# Patient Record
Sex: Female | Born: 1980 | Race: White | Hispanic: Yes | Marital: Married | Smoking: Never smoker
Health system: Southern US, Community
[De-identification: ages and names within clinical notes are randomized; demographics above are authoritative.]

## PROBLEM LIST (undated history)

## (undated) DIAGNOSIS — M3500 Sicca syndrome, unspecified: Secondary | ICD-10-CM

## (undated) DIAGNOSIS — H469 Unspecified optic neuritis: Secondary | ICD-10-CM

## (undated) DIAGNOSIS — G378 Other specified demyelinating diseases of central nervous system: Secondary | ICD-10-CM

## (undated) DIAGNOSIS — G3781 Myelin oligodendrocyte glycoprotein antibody disease: Secondary | ICD-10-CM

## (undated) HISTORY — DX: Myelin oligodendrocyte glycoprotein antibody disease: G37.81

## (undated) HISTORY — DX: Other specified demyelinating diseases of central nervous system: G37.8

## (undated) HISTORY — DX: Unspecified optic neuritis: H46.9

## (undated) HISTORY — DX: Sjogren syndrome, unspecified: M35.00

---

## 2009-02-05 HISTORY — PX: DILATION AND CURETTAGE OF UTERUS: SHX78

## 2011-07-26 ENCOUNTER — Other Ambulatory Visit: Payer: Self-pay | Admitting: Family Medicine

## 2011-07-26 ENCOUNTER — Ambulatory Visit
Admission: RE | Admit: 2011-07-26 | Discharge: 2011-07-26 | Disposition: A | Discharge status: Home / Self Care | Payer: No Typology Code available for payment source | Source: Ambulatory Visit | Attending: Family Medicine | Admitting: Family Medicine

## 2011-07-26 DIAGNOSIS — Z0000 Encounter for general adult medical examination without abnormal findings: Secondary | ICD-10-CM

## 2016-07-06 LAB — HEPATIC FUNCTION PANEL
ALT: 14 (ref 7–35)
AST: 17 (ref 13–35)

## 2016-07-06 LAB — LIPID PANEL
HDL: 63 (ref 35–70)
LDL Cholesterol: 81
Triglycerides: 53 (ref 40–160)

## 2016-07-06 LAB — COMPREHENSIVE METABOLIC PANEL
Albumin: 4.4 (ref 3.5–5.0)
Calcium: 9.3 (ref 8.7–10.7)

## 2016-07-06 LAB — CBC AND DIFFERENTIAL
Hemoglobin: 12.1 (ref 12.0–16.0)
WBC: 5.7

## 2016-07-06 LAB — HEMOGLOBIN A1C: Hemoglobin A1C: 4.9

## 2016-07-06 LAB — BASIC METABOLIC PANEL
Creatinine: 0.1 — AB (ref 0.5–1.1)
Glucose: 83

## 2016-07-06 LAB — TSH: TSH: 1.57 (ref 0.41–5.90)

## 2017-06-14 ENCOUNTER — Encounter: Payer: Self-pay | Admitting: Family Medicine

## 2017-06-14 ENCOUNTER — Ambulatory Visit: Payer: Self-pay | Admitting: Pharmacist

## 2017-06-14 ENCOUNTER — Ambulatory Visit: Payer: Self-pay | Admitting: Family Medicine

## 2017-06-14 ENCOUNTER — Other Ambulatory Visit: Payer: Self-pay

## 2017-06-14 VITALS — BP 98/64 | HR 97 | Temp 98.3°F | Ht 66.5 in | Wt 160.4 lb

## 2017-06-14 DIAGNOSIS — Y9315 Activity, underwater diving and snorkeling: Secondary | ICD-10-CM

## 2017-06-14 DIAGNOSIS — Z0000 Encounter for general adult medical examination without abnormal findings: Secondary | ICD-10-CM

## 2017-06-14 LAB — GLUCOSE, POCT (MANUAL RESULT ENTRY): POC Glucose: 105 mg/dl — AB (ref 70–99)

## 2017-06-14 LAB — POCT HEMOGLOBIN: HEMOGLOBIN: 12.9 g/dL (ref 12.2–16.2)

## 2017-06-14 LAB — POCT UA - GLUCOSE/PROTEIN
GLUCOSE UA: NEGATIVE
Protein, UA: NEGATIVE

## 2017-06-14 NOTE — Assessment & Plan Note (Signed)
Vision (distance, near, color), hearing, UA, CBG, Hgb, and Spirometry all within normal limits. Normal CXR within past 5 years. EKG:  N/a due to age Coronary assessment:  N/a due to age -- low risk Approval for SCUBA diving, I find no medical conditions considered incompatible with diving.

## 2017-06-14 NOTE — Progress Notes (Signed)
Subjective:    Joyce [name] is a 37 y.o. female who presents to [facility] today for scuba diving physical for the [facility]:  1.  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  Has progressed through ranks to Personnel officer.  Previously employed by [employer] in 2013, left for [facility], now back for family and occupational reasons.  Had multiple fitness to dive physicals there.     Denies any complications or injuries while diving.  Has never failed a fitness to dive physical.  Currently well, without complaints.  The following portions of the patient's history were reviewed and updated as appropriate: allergies, current medications, past medical history, family and social history, and problem list.  PMH reviewed.  History of seasonal allergies/hay fever.  Had D&C in 2011, otherwise denies any surgeries.  2 normal pregnancies 2009 and 2012.  No other hospitalizations.   Medications reviewed. No current outpatient medications on file.   No current facility-administered medications for this visit.     Family History: -Positive for high cholesterol father, stroke in grandfather, diabetes type 2 mother, otherwise negative  Social: - Never smoker - Denies illicit drug use - Very occasional social drinker (1-2 drinks)  SCUBA ROS:  Denies any history of middle ear trauma/disease, vertigo, ocular surgery, asthma or other respiratory issues, seizures, loss of consciousness, recurring neurologic disorders, history of head injury, coagulopathies, evidence of CAD or other structural heart disease, pneumothorax, diabetes, or exercise intolerance.    General ROS:  The patient denies fever, unusual weight change, decreased hearing, chest pain, palpitations, pre-syncopal or syncopal episodes, dyspnea on exertion, prolonged cough, hemoptysis, change in bowel habits, melena, hematochezia, severe indigestion/heartburn, nausea/vomiting/abdominal  pain, genital sores, muscle weakness, difficulty walking, abnormal bleeding, or enlarged lymph nodes.     Objective:   Physical Exam BP 98/64   Pulse 97   Temp 98.3 F (36.8 C) (Oral)   Ht 5' 6.5" (1.689 m)   Wt 160 lb 6.4 oz (72.8 kg)   LMP 05/20/2017   SpO2 99%   BMI 25.50 kg/m  Gen:  Alert, cooperative patient who appears stated age in no acute distress.  Vital signs reviewed. Head:  [place]/AT Eyes:  Fundoscopy WNL BL.  PERRL, EOMI Ears:  External ears WNL, Bilateral TM's normal without retraction, redness or bulging.  Canals clear BL  Mouth:  Good dental hygiene. Tonsils non-erythematous, non-edematous.   MMM Neck:  Trachea midline Cardiac:  Regular rate and rhythm without murmur auscultated.   Pulm:  Clear to auscultation bilaterally with good air movement throuhout.  No wheezes or rales noted.   Abd:  Soft/nondistended/nontender.  Good bowel sounds throughout all four quadrants.  No masses noted.  Exts: No edema BL LE's, warm and well-perfused Neuro:  Alert and oriented to person, place, and date.  CN II-XII intact.  Sensation intact to light touch and vibration bilateral upper and lower extremities equally.  Motor function equal and strength 5/5 bilateral upper and lower extremities.  Normal gait.  DTRs +2 BL tricep, brachialis, patellar, and achilles.  Romberg negative.  Color vision testing normal. Psych:  Not depressed or anxious appearing.  Linear and coherent thought process as evidenced by speech pattern. Smiles spontaneously.   Results for orders placed or performed in visit on 06/14/17 (from the past 72 hour(s))  Hemoglobin     Status: None   Collection Time: 06/14/17  9:02 AM  Result Value Ref Range   Hemoglobin 12.9  12.2 - 16.2 g/dL  Glucose (CBG)     Status: Abnormal   Collection Time: 06/14/17  9:02 AM  Result Value Ref Range   POC Glucose 105 (A) 70 - 99 mg/dl  Urinalysis - Glucose/Protein     Status: None   Collection Time: 06/14/17  9:05 AM  Result Value Ref  Range   Glucose, UA NEGATIVE    Protein, UA NEGATIVE

## 2017-06-14 NOTE — Progress Notes (Signed)
S:    Patient arrives ambulating well and in good spirits.  Presents for lung function evaluation for "dive physical". Patient reports breathing has been good.  Denies history or breathing difficulty.   O: Patient provided good effort while attempting spirometry.  FVC 3.60    Calculated Lower Limit for [employer] Diving Standards   FVC = 3.29 FEV1 3.0       Calculated Lower Limit for [employer]AA Diving Standards   FEV1= 2.68 FEV1/FVC  83.8   Calculated Lower Limit for NOAA Diving Standards   FEV1/FVC = 73.1  See "scanned report" or Documentation Flowsheet (discrete results - PFTs) for  Spirometry results and copy of evaluation.   A/P: Spirometry evaluation without bronchodilator reveals normal lung function.  FEV1, FVC and FEV1/FVC ratio all exceed threshold for spirometric parameters.   Reviewed results of pulmonary function tests.  Pt verbalized understanding of results and education.  Patient seen with Tama Headings, PharmD Candidate and Ladell Pier, PharmD, PGY1 Pharmacy Resident.

## 2017-07-30 ENCOUNTER — Other Ambulatory Visit: Payer: Self-pay

## 2017-07-30 ENCOUNTER — Encounter: Payer: Self-pay | Admitting: Obstetrics & Gynecology

## 2017-07-30 ENCOUNTER — Other Ambulatory Visit (HOSPITAL_COMMUNITY)
Admission: RE | Admit: 2017-07-30 | Discharge: 2017-07-30 | Disposition: A | Discharge status: Home / Self Care | Payer: BLUE CROSS/BLUE SHIELD | Source: Ambulatory Visit | Attending: Obstetrics & Gynecology | Admitting: Obstetrics & Gynecology

## 2017-07-30 ENCOUNTER — Ambulatory Visit (INDEPENDENT_AMBULATORY_CARE_PROVIDER_SITE_OTHER): Payer: BLUE CROSS/BLUE SHIELD | Admitting: Obstetrics & Gynecology

## 2017-07-30 VITALS — BP 112/64 | HR 66 | Resp 14 | Ht 66.0 in | Wt 154.8 lb

## 2017-07-30 DIAGNOSIS — Z124 Encounter for screening for malignant neoplasm of cervix: Secondary | ICD-10-CM | POA: Diagnosis not present

## 2017-07-30 DIAGNOSIS — Z30432 Encounter for removal of intrauterine contraceptive device: Secondary | ICD-10-CM

## 2017-07-30 DIAGNOSIS — Z01419 Encounter for gynecological examination (general) (routine) without abnormal findings: Secondary | ICD-10-CM | POA: Diagnosis not present

## 2017-07-30 LAB — POCT URINE PREGNANCY: PREG TEST UR: NEGATIVE

## 2017-07-30 MED ORDER — NORETHINDRONE 0.35 MG PO TABS: 1.0000 | ORAL_TABLET | Freq: Every day | ORAL | 3 refills | Status: DC

## 2017-07-30 NOTE — Progress Notes (Signed)
37 y.o. [id number] MarriedCaucasianF here for annual exam/new patient exam.  She has a Mirena that needs to be removed today.  It was placed 06/2010.  Is considering tubal ligation vs. Vasectomy.  Additional options regarding contraception was discussed including other IUD options, nexplanon, and progesterone options.    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  Has rigorous physicals related to work.  Does not need any screening blood work as this is done with these physicals.  Patient's last menstrual period was 07/20/2017.          Sexually active: Yes.    The current method of family planning is IUD.    Exercising: Yes.    walking, swimming  Smoker:  no  Health Maintenance: Pap:  06/2010 in [place]  History of abnormal Pap:  no MMG:  never Colonoscopy:  never BMD:   never TDaP:  2018  Pneumonia vaccine(s):  no Shingrix:   no Hep C testing: not indicated  Screening Labs: done with PCP prior to moving, Hb today: same, Urine today: not collected    reports that she has never smoked. She has never used smokeless tobacco. She reports that she drinks alcohol. She reports that she does not use drugs.  History reviewed. No pertinent past medical history.  Past Surgical History:  Procedure Laterality Date  . DILATION AND CURETTAGE OF UTERUS  2011    Current Outpatient Medications  Medication Sig Dispense Refill  . levonorgestrel (MIRENA) 20 MCG/24HR IUD 1 each by Intrauterine route once.     No current facility-administered medications for this visit.     Family History  Problem Relation Age of Onset  . Diabetes Mother   . Hyperlipidemia Father     Review of Systems  All other systems reviewed and are negative.   Exam:   BP 112/64 (BP Location: Right Arm, Patient Position: Sitting, Cuff Size: Normal)   Pulse 66   Resp 14   Ht 5\' 6"  (1.676 m)   Wt 154 lb 12.8 oz (70.2 kg)   LMP 07/20/2017   BMI 24.99 kg/m    Height: 5\' 6"   (167.6 cm)  Ht Readings from Last 3 Encounters:  07/30/17 5\' 6"  (1.676 m)  06/14/17 5' 6.5" (1.689 m)    General appearance: alert, cooperative and appears stated age Head: Normocephalic, without obvious abnormality, atraumatic Neck: no adenopathy, supple, symmetrical, trachea midline and thyroid normal to inspection and palpation Lungs: clear to auscultation bilaterally Breasts: normal appearance, no masses or tenderness Heart: regular rate and rhythm Abdomen: soft, non-tender; bowel sounds normal; no masses,  no organomegaly Extremities: extremities normal, atraumatic, no cyanosis or edema Skin: Skin color, texture, turgor normal. No rashes or lesions Lymph nodes: Cervical, supraclavicular, and axillary nodes normal. No abnormal inguinal nodes palpated Neurologic: Grossly normal   Pelvic: External genitalia:  no lesions              Urethra:  normal appearing urethra with no masses, tenderness or lesions              Bartholins and Skenes: normal                 Vagina: normal appearing vagina with normal color and discharge, no lesions              Cervix: no lesions              Pap taken: Yes.   Bimanual Exam:  Uterus:  normal size, contour, position, consistency, mobility, non-tender              Adnexa: normal adnexa and no mass, fullness, tenderness               Rectovaginal: Confirms  Procedure:  Speculum placed.  IUD string grasped and with one pull was easily removed.  Pt tolerated procedure well.  No bleeding was noted.    Chaperone was present for exam.  A:  Well Woman with normal exam Mirena IUD present that should have been removed >2 years ago Desires other contraceptive options  P:   Mammogram guidelines reivewed pap smear and HR HPV obtained today IUD was removed successfully today Options for contraception reviewed.  Will start micronor for now.  Use, side effects reviewed.  #1 month supply with #3 refills to pharmacy Will precert tubal ligation vs  salpingectomy for pt Return annually or prn

## 2017-07-31 ENCOUNTER — Telehealth: Payer: Self-pay | Admitting: Obstetrics & Gynecology

## 2017-07-31 NOTE — Telephone Encounter (Signed)
Call to [facility], spoke with Tessa. RX norethindrone received and ready for pick up.   Spoke with patient, advised as seen above. Patient verbalizes understanding and thankful for return call. Encounter closed.

## 2017-07-31 NOTE — Telephone Encounter (Signed)
Patient returned call. Review benefits for possible bilateral salpingectomy verses a tubal ligation. Patient appreciative for information. Patient states she will call back to advise how she would like to proceed.  Patient mentioned in conversation, the [facility] pharmacy on [address] does not have a prescription for her birth control pills.  Routing to Triage Nurse  cc: Billie Ruddy, RN

## 2017-07-31 NOTE — Telephone Encounter (Signed)
Call placed to patient to review benefits for possible surgery. Left voicemail message requesting a return call.   cc: Billie Ruddy, RN

## 2017-08-02 LAB — CYTOLOGY - PAP
Diagnosis: NEGATIVE
HPV (WINDOPATH): NOT DETECTED

## 2017-08-02 MED ORDER — FLUCONAZOLE 150 MG PO TABS: 150.0000 mg | ORAL_TABLET | Freq: Once | ORAL | 0 refills | Status: AC

## 2017-08-02 NOTE — Addendum Note (Signed)
Addended by: Jerene Bears on: 08/02/2017 05:14 PM   Modules accepted: Orders

## 2017-09-17 DIAGNOSIS — J039 Acute tonsillitis, unspecified: Secondary | ICD-10-CM | POA: Diagnosis not present

## 2017-11-13 ENCOUNTER — Other Ambulatory Visit: Payer: Self-pay | Admitting: Obstetrics & Gynecology

## 2017-11-13 NOTE — Telephone Encounter (Signed)
Medication refill request: Micronor Last AEX:  07/30/17 Next AEX: 11/07/18 Last MMG (if hormonal medication request): NA Refill authorized: #[id number] with 6 Refills

## 2018-10-01 ENCOUNTER — Ambulatory Visit: Payer: BLUE CROSS/BLUE SHIELD | Admitting: Osteopathic Medicine

## 2018-10-13 ENCOUNTER — Encounter: Payer: Self-pay | Admitting: *Deleted

## 2018-10-13 ENCOUNTER — Other Ambulatory Visit: Payer: Self-pay

## 2018-10-13 ENCOUNTER — Emergency Department (INDEPENDENT_AMBULATORY_CARE_PROVIDER_SITE_OTHER)
Admission: EM | Admit: 2018-10-13 | Discharge: 2018-10-13 | Disposition: A | Discharge status: Home / Self Care | Payer: BC Managed Care – PPO | Source: Home / Self Care

## 2018-10-13 DIAGNOSIS — L03119 Cellulitis of unspecified part of limb: Secondary | ICD-10-CM | POA: Diagnosis not present

## 2018-10-13 MED ORDER — DOXYCYCLINE HYCLATE 100 MG PO TABS: 100.0000 mg | ORAL_TABLET | Freq: Two times a day (BID) | ORAL | 0 refills | Status: DC

## 2018-10-13 MED ORDER — MUPIROCIN 2 % EX OINT: 1.0000 "application " | TOPICAL_OINTMENT | Freq: Three times a day (TID) | CUTANEOUS | 1 refills | Status: DC

## 2018-10-13 NOTE — ED Triage Notes (Signed)
Patient c[place] [facility] [facility] about 10 days ago on each leg. She was wearing pants. Both sites have surrounding redness. . She more recently developed possibly poison ivy on her right ankle.

## 2018-10-13 NOTE — ED Provider Notes (Signed)
[facility]    CSN: [id number] Arrival date & time: 10/13/18  1928      History   Chief Complaint Chief Complaint  Patient presents with  . Rash    Right ankle  . Wound Check    Bilateral ankles    HPI Joyce [name] is a 38 y.o. female.   Patient c/o being b[place] [facility] [place] [facility] about 10 days ago on each leg. She was wearing pants. Both sites have surrounding redness. . She more recently developed possibly poison ivy on her right ankle.      History reviewed. No pertinent past medical history.  Patient Active Problem List   Diagnosis Date Noted  . Activities involving scuba diving 06/14/2017    Past Surgical History:  Procedure Laterality Date  . DILATION AND CURETTAGE OF UTERUS  2011    OB History    Gravida  3   Para  2   Term  2   Preterm      AB  1   Living        SAB  1   TAB      Ectopic      Multiple      Live Births               Home Medications    Prior to Admission medications   Not on File    Family History Family History  Problem Relation Age of Onset  . Diabetes Mother   . Hyperlipidemia Father     Social History Social History   Tobacco Use  . Smoking status: Never Smoker  . Smokeless tobacco: Never Used  Substance Use Topics  . Alcohol use: Yes    Comment: occasionally   . Drug use: Never     Allergies   Patient has no known allergies.   Review of Systems Review of Systems  Skin: Positive for rash and wound.  All other systems reviewed and are negative.    Physical Exam Triage Vital Signs ED Triage Vitals  Enc Vitals Group     BP 10/13/18 2007 122/77     Pulse Rate 10/13/18 2007 73     Resp 10/13/18 2007 14     Temp 10/13/18 2007 98.2 F (36.8 C)     Temp Source 10/13/18 2007 Oral     SpO2 10/13/18 2007 99 %     Weight 10/13/18 2010 155 lb (70.3 kg)     Height 10/13/18 2010 5\' 6"  (1.676 m)     Head Circumference --      Peak Flow --      Pain Score  10/13/18 2010 2     Pain Loc --      Pain Edu? --      Excl. in GC? --    No data found.  Updated Vital Signs BP 122/77 (BP Location: Right Arm)   Pulse 73   Temp 98.2 F (36.8 C) (Oral)   Resp 14   Ht 5\' 6"  (1.676 m)   Wt 70.3 kg   LMP 10/06/2018   SpO2 99%   BMI 25.02 kg/m    Physical Exam Vitals signs and nursing note reviewed.  Constitutional:      Appearance: Normal appearance.  Eyes:     Conjunctiva/sclera: Conjunctivae normal.  Neck:     Musculoskeletal: Normal range of motion and neck supple.  Pulmonary:     Effort: Pulmonary effort is normal.  Skin:  Comments: Bilateral 1 cm shallow posterior lower leg ulcerations with yellow eschar and surrounding erythema.  Neurological:     General: No focal deficit present.     Mental Status: She is alert.  Psychiatric:        Mood and Affect: Mood normal.      UC Treatments / Results  Labs (all labs ordered are listed, but only abnormal results are displayed) Labs Reviewed - No data to display  EKG   Radiology No results found.  Procedures Procedures (including critical care time)  Medications Ordered in UC Medications - No data to display  Initial Impression / Assessment and Plan / UC Course  I have reviewed the triage vital signs and the nursing notes.  Pertinent labs & imaging results that were available during my care of the patient were reviewed by me and considered in my medical decision making (see chart for details).    Final Clinical Impressions(s) / UC Diagnoses   Final diagnoses:  None   Discharge Instructions   None    ED Prescriptions    None     Controlled Substance Prescriptions [place] Controlled Substance Registry consulted? Not Applicable   Robyn Haber, MD 10/13/18 2049

## 2018-10-13 NOTE — Discharge Instructions (Addendum)
Avoid neosporin, alcohol, hydrogen peroxide and betadine on the wound  Instead, wash gently with soap and water twice a day.

## 2018-11-05 ENCOUNTER — Other Ambulatory Visit: Payer: Self-pay

## 2018-11-06 NOTE — Progress Notes (Signed)
38 y.o. [id number] Married White or Caucasian female here for annual exam. Patient would like to discuss IUD use.  Using condoms right now.  Cycle was regular and normal.  Would like to have this placed today.    Mother with hx of polyps.  Her GI recommended children be screened starting at 26.    No LMP recorded.          Sexually active: Yes.    The current method of family planning is condoms most of the time.    Exercising: No.  The patient does not participate in regular exercise at present. Smoker:  no  Health Maintenance: Pap:  07/30/17 with neg HR HPV-- normal  History of abnormal Pap:  no TDaP:  2018 Pneumonia vaccine(s):  na Shingrix:   n/a Screening Labs: if needed    reports that she has never smoked. She has never used smokeless tobacco. She reports current alcohol use. She reports that she does not use drugs.  No past medical history on file.  Past Surgical History:  Procedure Laterality Date  . DILATION AND CURETTAGE OF UTERUS  2011    Current Outpatient Medications  Medication Sig Dispense Refill  . Multiple Vitamin (MULTIVITAMIN) tablet Take 1 tablet by mouth daily.     No current facility-administered medications for this visit.     Family History  Problem Relation Age of Onset  . Diabetes Mother   . Colon polyps Mother   . Hyperlipidemia Father     Review of Systems  All other systems reviewed and are negative.   Exam:   BP 118/64   Pulse 77   Temp 98.5 F (36.9 C)   Ht 5' 5.5" (1.664 m)   Wt 154 lb (69.9 kg)   SpO2 98%   BMI 25.24 kg/m   Height: 5' 5.5" (166.4 cm)  Ht Readings from Last 3 Encounters:  11/07/18 5' 5.5" (1.664 m)  10/13/18 5\' 6"  (1.676 m)  07/30/17 5\' 6"  (1.676 m)   General appearance: alert, cooperative and appears stated age Head: Normocephalic, without obvious abnormality, atraumatic Neck: no adenopathy, supple, symmetrical, trachea midline and thyroid normal to inspection and palpation Lungs: clear to auscultation  bilaterally Breasts: normal appearance, no masses or tenderness Heart: regular rate and rhythm Abdomen: soft, non-tender; bowel sounds normal; no masses,  no organomegaly Extremities: extremities normal, atraumatic, no cyanosis or edema Skin: Skin color, texture, turgor normal. No rashes or lesions Lymph nodes: Cervical, supraclavicular, and axillary nodes normal. No abnormal inguinal nodes palpated Neurologic: Grossly normal   Pelvic: External genitalia:  no lesions              Urethra:  normal appearing urethra with no masses, tenderness or lesions              Bartholins and Skenes: normal                 Vagina: normal appearing vagina with normal color and discharge, no lesions              Cervix: no lesions              Pap taken: No. Bimanual Exam:  Uterus:  normal size, contour, position, consistency, mobility, non-tender              Adnexa: normal adnexa and no mass, fullness, tenderness               Rectovaginal: Confirms  Anus:  normal sphincter tone, no lesions  Procedure: Speculum placed.  Cervix cleansed with Betadine x 3.  Single toothed tenaculum applied to anterior lip of cervix.  Uterus sounded to 8cm after dilation of cervix with milex dilator.  IUD inserted to 8cm and then released.  Strings cut to 2cm.  IUD then was expelled immediately out of cervix.  IUD grasped and removed.  Due to pt discomfort procedure was ended.  Tenaculum was removed.  Small at of bleeding was noted at tenaculum sites.  This stopped with compression.  Pt toelrated procedure well.    Chaperone was present for exam.  A:  Well Woman with normal exam Desires for contraception Failed Paragard placement today Family hx of colon polyps in her mother  P:   Mammogram guidelines reviewed pap smear with neg HR HPV 2019.  Not indicated today. Will refer for colonoscopy at age 69 Pt will call with onset of cycle for Paragard placement.  This will be done with ultrasound guidance if  possible due to failed procedure today. Return annually or prn

## 2018-11-07 ENCOUNTER — Encounter: Payer: Self-pay | Admitting: Obstetrics & Gynecology

## 2018-11-07 ENCOUNTER — Other Ambulatory Visit: Payer: Self-pay

## 2018-11-07 ENCOUNTER — Ambulatory Visit (INDEPENDENT_AMBULATORY_CARE_PROVIDER_SITE_OTHER): Payer: BC Managed Care – PPO | Admitting: Obstetrics & Gynecology

## 2018-11-07 VITALS — BP 118/64 | HR 77 | Temp 98.5°F | Ht 65.5 in | Wt 154.0 lb

## 2018-11-07 DIAGNOSIS — Z01419 Encounter for gynecological examination (general) (routine) without abnormal findings: Secondary | ICD-10-CM | POA: Diagnosis not present

## 2018-11-07 DIAGNOSIS — Z30014 Encounter for initial prescription of intrauterine contraceptive device: Secondary | ICD-10-CM | POA: Diagnosis not present

## 2018-11-17 ENCOUNTER — Encounter: Payer: Self-pay | Admitting: Osteopathic Medicine

## 2018-11-17 ENCOUNTER — Other Ambulatory Visit: Payer: Self-pay

## 2018-11-17 ENCOUNTER — Ambulatory Visit (INDEPENDENT_AMBULATORY_CARE_PROVIDER_SITE_OTHER): Payer: BC Managed Care – PPO | Admitting: Osteopathic Medicine

## 2018-11-17 VITALS — BP 109/71 | HR 64 | Temp 98.6°F | Ht 65.5 in | Wt 152.1 lb

## 2018-11-17 DIAGNOSIS — Z0000 Encounter for general adult medical examination without abnormal findings: Secondary | ICD-10-CM

## 2018-11-17 NOTE — Progress Notes (Signed)
HPI: Joyce ReachSarah [name] is a 38 y.o. female who  has no past medical history on file.  [facility], 11/17/18,  for chief complaint of: Annual / Establish care   New to establish   No major concerns today other than stress noted in HPI on intake. Work has been busy, Manufacturing systems engineershort-staffed, she runs the [employer], sees [facility] on occasion as needed for [employer] certification, brings most recent labs form 2018 which were all good (A1C, CMP, CBC, TSH).   No major medical problems. Never smoker.  UTD on well-woman exam which she had done 11/07/18. [id number], D&C for miscarriage 2011.   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 Healing well    Past medical, surgical, social and family history reviewed:  Patient Active Problem List   Diagnosis Date Noted  . Activities involving scuba diving 06/14/2017    Past Surgical History:  Procedure Laterality Date  . DILATION AND CURETTAGE OF UTERUS  2011    Social History   Tobacco Use  . Smoking status: Never Smoker  . Smokeless tobacco: Never Used  Substance Use Topics  . Alcohol use: Yes    Comment: occasionally     Family History  Problem Relation Age of Onset  . Diabetes Mother   . Colon polyps Mother   . Hyperlipidemia Father   . High blood pressure Father   . Breast cancer Paternal Grandmother        Post menopause  . Heart attack Paternal Grandfather      Current medication list and allergy/intolerance information reviewed:    Current Outpatient Medications  Medication Sig Dispense Refill  . Multiple Vitamin (MULTIVITAMIN) tablet Take 1 tablet by mouth daily.     No current facility-administered medications for this visit.     No Known Allergies    Review of Systems:  Constitutional:  No  fever, no chills, No recent illness, No unintentional weight  changes. No significant fatigue.   HEENT: No  headache, no vision change, no hearing change, No sore throat, No  sinus pressure  Cardiac: No  chest pain, No  pressure, No palpitations, No  Orthopnea  Respiratory:  No  shortness of breath. No  Cough  Gastrointestinal: No  abdominal pain, No  nausea, No  vomiting,  No  blood in stool, No  diarrhea, No  constipation   Musculoskeletal: No new myalgia/arthralgia  Skin: No  Rash, No other wounds/concerning lesions  Genitourinary: No  incontinence, No  abnormal genital bleeding, No abnormal genital discharge  Hem/Onc: No  easy bruising/bleeding, No  abnormal lymph node  Endocrine: No cold intolerance,  No heat intolerance. No polyuria/polydipsia/polyphagia   Neurologic: No  weakness, No  dizziness, No  slurred speech/focal weakness/facial droop  Psychiatric: No  concerns with depression, No  concerns with anxiety, No sleep problems, No mood problems, +tress but coping ok   Exam:  BP 109/71 (BP Location: Left Arm, Patient Position: Sitting, Cuff Size: Normal)   Pulse 64   Temp 98.6 F (37 C) (Oral)   Ht 5' 5.5" (1.664 m)   Wt 152 lb 1.9 oz (69 kg)   LMP 10/27/2018   BMI 24.93 kg/m   Constitutional: VS see above. General Appearance: alert, well-developed, well-nourished, NAD  Eyes: Normal lids and conjunctive, non-icteric sclera  Ears, Nose, Mouth, Throat: MMM, Normal external inspection ears/nares/mouth/lips/gums. TM  normal bilaterally. Pharynx/tonsils no erythema, no exudate. Nasal mucosa normal.   Neck: No masses, trachea midline. No thyroid enlargement. No tenderness/mass appreciated. No lymphadenopathy  Respiratory: Normal respiratory effort. no wheeze, no rhonchi, no rales  Cardiovascular: S1/S2 normal, no murmur, no rub/gallop auscultated. RRR. No lower extremity edema. Pedal pulse II/IV bilaterally DP and PT. No carotid bruit or JVD. No abdominal aortic bruit.  Gastrointestinal: Nontender, no masses. No hepatomegaly, no  splenomegaly. No hernia appreciated. Bowel sounds normal. Rectal exam deferred.   Musculoskeletal: Gait normal. No clubbing/cyanosis of digits.   Neurological: Normal balance/coordination. No tremor. No cranial nerve deficit on limited exam. Motor and sensation intact and symmetric. Cerebellar reflexes intact.   Skin: warm, dry, intact. No rash/ulcer. No concerning nevi or subq nodules on limited exam.    Psychiatric: Normal judgment/insight. Normal mood and affect. Oriented x3.           ASSESSMENT/PLAN: The encounter diagnosis was Annual physical exam.   Orders Placed This Encounter  Procedures  . COMPLETE METABOLIC PANEL WITH GFR  . Lipid panel  . CBC    No orders of the defined types were placed in this encounter.   Patient Instructions  General Preventive Care  Most recent routine screening lipids/other labs: today   Everyone should have blood pressure checked once per year.   Tobacco: don't!   Alcohol: responsible moderation is ok for most adults - if you have concerns about your alcohol intake, please talk to me!   Exercise: as tolerated to reduce risk of cardiovascular disease and diabetes. Strength training will also prevent osteoporosis.   Mental health: if need for mental health care (medicines, counseling, other), or concerns about moods, please let me know!   Sexual health: if need for STD testing, or if concerns with libido/pain problems, please let me know! If you need to discuss your birth control options, please let me know!   Advanced Directive: Living Will and/or Healthcare Power of Attorney ecommended for all adults, regardless of age or health.  Vaccines  Flu vaccine: recommended for almost everyone, every fall.   Shingles vaccine: Shingrix recommended after age 80.   Pneumonia vaccines: Prevnar and Pneumovax recommended after age 19  Tetanus booster: Tdap recommended every 5 years.  Cancer screenings   Colon cancer screening:  recommended age 56  Breast cancer screening: mammogram recommended at age 32 every other year at least, and annually after age 22.   Cervical cancer screening: Pap every 1 to 5 years depending on age and other risk factors. Can usually stop at age 17 or w/ hysterectomy.   Lung cancer screening: not needed for non-smokers  Infection screenings . HIV: recommended screening at least once age 65-65, more often as needed. . Gonorrhea/Chlamydia: screening as needed, . Hepatitis C: recommended for anyone born 53-1965 . TB: certain at-risk populations, or depending on work requirements and/or travel history Other . Bone Density Test: recommended for women at age 60        Visit summary with medication list and pertinent instructions was printed for patient to review. All questions at time of visit were answered - patient instructed to contact office with any additional concerns or updates. ER/RTC precautions were reviewed with the patient.     Please note: voice recognition software was used to produce this document, and typos may escape review. Please contact Dr. Sheppard Coil for any needed clarifications.     Follow-up plan: Return in about 1 year (around 11/17/2019) for [place] (call week prior to  visit for lab orders) / sooner if needed! Marland Kitchen

## 2018-11-17 NOTE — Patient Instructions (Signed)
General Preventive Care  Most recent routine screening lipids/other labs: today   Everyone should have blood pressure checked once per year.   Tobacco: don't!   Alcohol: responsible moderation is ok for most adults - if you have concerns about your alcohol intake, please talk to me!   Exercise: as tolerated to reduce risk of cardiovascular disease and diabetes. Strength training will also prevent osteoporosis.   Mental health: if need for mental health care (medicines, counseling, other), or concerns about moods, please let me know!   Sexual health: if need for STD testing, or if concerns with libido/pain problems, please let me know! If you need to discuss your birth control options, please let me know!   Advanced Directive: Living Will and/or Healthcare Power of Attorney ecommended for all adults, regardless of age or health.  Vaccines  Flu vaccine: recommended for almost everyone, every fall.   Shingles vaccine: Shingrix recommended after age 26.   Pneumonia vaccines: Prevnar and Pneumovax recommended after age 1  Tetanus booster: Tdap recommended every 5 years.  Cancer screenings   Colon cancer screening: recommended age 84  Breast cancer screening: mammogram recommended at age 14 every other year at least, and annually after age 59.   Cervical cancer screening: Pap every 1 to 5 years depending on age and other risk factors. Can usually stop at age 74 or w/ hysterectomy.   Lung cancer screening: not needed for non-smokers  Infection screenings . HIV: recommended screening at least once age 66-65, more often as needed. . Gonorrhea/Chlamydia: screening as needed, . Hepatitis C: recommended for anyone born 23-1965 . TB: certain at-risk populations, or depending on work requirements and/or travel history Other . Bone Density Test: recommended for women at age 30

## 2018-11-18 LAB — COMPLETE METABOLIC PANEL WITH GFR
AG Ratio: 1.1 (calc) (ref 1.0–2.5)
ALT: 15 U/L (ref 6–29)
AST: 16 U/L (ref 10–30)
Albumin: 4.2 g/dL (ref 3.6–5.1)
Alkaline phosphatase (APISO): 49 U/L (ref 31–125)
BUN: 11 mg/dL (ref 7–25)
CO2: 24 mmol/L (ref 20–32)
Calcium: 9.3 mg/dL (ref 8.6–10.2)
Chloride: 104 mmol/L (ref 98–110)
Creat: 0.87 mg/dL (ref 0.50–1.10)
GFR, Est African American: 98 mL/min/{1.73_m2} (ref >=60)
GFR, Est Non African American: 85 mL/min/{1.73_m2} (ref >=60)
Globulin: 3.9 g/dL (calc) — ABNORMAL HIGH (ref 1.9–3.7)
Glucose, Bld: 83 mg/dL (ref 65–99)
Potassium: 3.9 mmol/L (ref 3.5–5.3)
Sodium: 136 mmol/L (ref 135–146)
Total Bilirubin: 0.5 mg/dL (ref 0.2–1.2)
Total Protein: 8.1 g/dL (ref 6.1–8.1)

## 2018-11-18 LAB — CBC
HCT: 37.8 % (ref 35.0–45.0)
Hemoglobin: 12.4 g/dL (ref 11.7–15.5)
MCH: 28.8 pg (ref 27.0–33.0)
MCHC: 32.8 g/dL (ref 32.0–36.0)
MCV: 87.9 fL (ref 80.0–100.0)
MPV: 11.8 fL (ref 7.5–12.5)
Platelets: 207 10*3/uL (ref 140–400)
RBC: 4.3 10*6/uL (ref 3.80–5.10)
RDW: 11.9 % (ref 11.0–15.0)
WBC: 5.3 10*3/uL (ref 3.8–10.8)

## 2018-11-18 LAB — LIPID PANEL
Cholesterol: 149 mg/dL
HDL: 59 mg/dL
LDL Cholesterol (Calc): 74 mg/dL
Non-HDL Cholesterol (Calc): 90 mg/dL
Total CHOL/HDL Ratio: 2.5 (calc)
Triglycerides: 76 mg/dL

## 2018-11-24 ENCOUNTER — Telehealth: Payer: Self-pay | Admitting: Obstetrics & Gynecology

## 2018-11-24 DIAGNOSIS — Z30014 Encounter for initial prescription of intrauterine contraceptive device: Secondary | ICD-10-CM

## 2018-11-24 NOTE — Telephone Encounter (Signed)
Patient's cycle came on Sunday and she's calling to schedule iud insertion.

## 2018-11-24 NOTE — Telephone Encounter (Signed)
Spoke with patient. LMP 11/23/18, request to proceed with Paragard IUD insertion.   Per review of 11/07/18 AEX notes, US guided IUD insertion recommended.   Scheduled for PUS with IUD insertion on 10/22 at 4pm with Dr. Sabra Heck. Orders placed for precert. Advised to take Motrin 800 mg with food and water one hour before procedure.   Routing to provider for final review. Patient is agreeable to disposition. Will close encounter.  Cc: Lerry Liner, Magdalene Patricia

## 2018-11-25 ENCOUNTER — Telehealth: Payer: Self-pay | Admitting: Obstetrics & Gynecology

## 2018-11-25 NOTE — Telephone Encounter (Signed)
Call placed to convey benefits for ultrasound guided IUD. Spoke with patient and conveyed benefits. Patient understands/agreeable with the benefits. Patient is aware of the cancellation policy. Appointment scheduled 11/27/18.

## 2018-11-27 ENCOUNTER — Ambulatory Visit (INDEPENDENT_AMBULATORY_CARE_PROVIDER_SITE_OTHER): Payer: BC Managed Care – PPO

## 2018-11-27 ENCOUNTER — Ambulatory Visit (INDEPENDENT_AMBULATORY_CARE_PROVIDER_SITE_OTHER): Payer: BC Managed Care – PPO | Admitting: Obstetrics & Gynecology

## 2018-11-27 ENCOUNTER — Other Ambulatory Visit: Payer: Self-pay

## 2018-11-27 DIAGNOSIS — Z30014 Encounter for initial prescription of intrauterine contraceptive device: Secondary | ICD-10-CM

## 2018-11-27 NOTE — Progress Notes (Signed)
38 y.o. G21P2010 Married Caucasian female presents for insertion of Paragard IUD.  Pt has decided to proceed with non-hormonal IUD use.  She has LMP 11/23/2018.    I did try to place IUD in office at last visit but was unsuccessful.  She is here for ultrasound guidance of placement due to prior difficulty.    Ultrasound findings today: Uterus:  9.1 x 5.8 x 3.6cm Endometrium:  1.22mm Left ovary:  2.7 x 1.3 x 2.0cm with 1.1 x 5.4UJ follicle Right ovary: 3.0 x 1.8 x 1.5cm  Cul de sac:  No free fluid  She has been counseled about alternative forms of contraception including OCPs, progesterone options, condoms, and natural family planning.  She feels IUD is the better option for her.  Pt has also been counseled about risks and benefits as well as complications.  Consent is obtained today.  All questions answered prior to start of procedure.    Current contraception: none Last STD testing:  Married and in monogamous relationship  LMP:  11/23/2018  Patient Active Problem List   Diagnosis Date Noted  . Activities involving scuba diving 06/14/2017   No past medical history on file. Current Outpatient Medications on File Prior to Visit  Medication Sig Dispense Refill  . Multiple Vitamin (MULTIVITAMIN) tablet Take 1 tablet by mouth daily.     No current facility-administered medications on file prior to visit.    Patient has no known allergies.  Review of Systems  All other systems reviewed and are negative.  There were no vitals filed for this visit.  Gen:  WNWF healthy female NAD Abdomen: soft, non-tender Groin:  no inguinal nodes palpated  Pelvic exam: Vulva:  normal female genitalia Vagina:  normal vagina Cervix:  Non-tender, Negative CMT, no lesions or redness. Uterus:  normal shape, position and consistency   Procedure:  Speculum reinserted.  Cervix visualized and cleansed with Betadine x 3.  Paracervical block was not placed.  Single toothed tenaculum applied to anterior lip of  cervix without difficulty.  Uterus sounded to 9cm.  Lot number: [id number].  Expiration:  08/2024.  IUD package was opened.  IUD and introducer passed to fundus and then withdrawn slightly before IUD was passed into endometrial cavity.  Introducer removed.  Strings cut to 2cm.  Tenaculum removed from cervix.  Minimal bleeding noted.  Pt tolerated the procedure well.  All instruments removed from vagina.  Ultrasound was used for visualization of passage of iud into endometrial cavity and to assess proper placement after insertion.  Ultrasound was used during placement to ensure proper location and then afterwards as well.  IUD is in correct location.  A: Insertion of Paragard IUD Contraception desires  P:  Return for recheck 6-8 weeks Pt aware to call for any concerns Pt aware removal due no later than 11/26/2028.  IUD card given to pt.

## 2018-11-28 ENCOUNTER — Encounter: Payer: Self-pay | Admitting: Obstetrics & Gynecology

## 2018-12-18 ENCOUNTER — Encounter: Payer: Self-pay | Admitting: Osteopathic Medicine

## 2019-01-21 ENCOUNTER — Other Ambulatory Visit: Payer: Self-pay

## 2019-01-23 ENCOUNTER — Other Ambulatory Visit: Payer: Self-pay

## 2019-01-23 ENCOUNTER — Encounter: Payer: Self-pay | Admitting: Obstetrics & Gynecology

## 2019-01-23 ENCOUNTER — Ambulatory Visit (INDEPENDENT_AMBULATORY_CARE_PROVIDER_SITE_OTHER): Payer: BC Managed Care – PPO | Admitting: Obstetrics & Gynecology

## 2019-01-23 VITALS — BP 104/60 | HR 72 | Temp 98.1°F | Resp 12 | Wt 155.0 lb

## 2019-01-23 DIAGNOSIS — R4586 Emotional lability: Secondary | ICD-10-CM

## 2019-01-23 DIAGNOSIS — Z30431 Encounter for routine checking of intrauterine contraceptive device: Secondary | ICD-10-CM | POA: Diagnosis not present

## 2019-01-23 MED ORDER — FLUOXETINE HCL 10 MG PO TABS: ORAL_TABLET | ORAL | 3 refills | Status: DC

## 2019-01-23 NOTE — Progress Notes (Signed)
GYNECOLOGY  VISIT  CC:   IUD recheck   HPI: 38 y.o. G76P2010 Married White or Caucasian female here for IUD recheck after having this placed 11/27/2018.  This was placed under U/S guidance.  She had two weeks of bleeding with her cycle in November.  It is heavy and she bled through her clothes.  This month, she only had a one day cycle.  Her cramping is also much worse than it has been in the past prior to this IUD.  Considering having it removed.    Also, having some concerns about mood changes around cycle.  Feels she is not herself at these times.  Was hoping stopping a hormonal method would help but doesn't think that is has.  PMDD treatment options discussed.  She is interested in luteal phase fluoxetine therapy.  GYNECOLOGIC HISTORY: Patient's last menstrual period was 12/26/2018. Contraception: Paragard IUD  Patient Active Problem List   Diagnosis Date Noted  . Activities involving scuba diving 06/14/2017    History reviewed. No pertinent past medical history.  Past Surgical History:  Procedure Laterality Date  . DILATION AND CURETTAGE OF UTERUS  2011    MEDS:   Current Outpatient Medications on File Prior to Visit  Medication Sig Dispense Refill  . Multiple Vitamin (MULTIVITAMIN) tablet Take 1 tablet by mouth daily.    Marland Kitchen PARAGARD INTRAUTERINE COPPER IU by Intrauterine route.     No current facility-administered medications on file prior to visit.    ALLERGIES: Patient has no known allergies.  Family History  Problem Relation Age of Onset  . Diabetes Mother   . Colon polyps Mother   . Hyperlipidemia Father   . High blood pressure Father   . Breast cancer Paternal Grandmother        Post menopause  . Heart attack Paternal Grandfather     SH:  Married, non smoker  Review of Systems  Genitourinary: Positive for menstrual problem.  Psychiatric/Behavioral: Positive for dysphoric mood.  All other systems reviewed and are negative.   PHYSICAL EXAMINATION:    BP  104/60 (BP Location: Right Arm, Patient Position: Sitting, Cuff Size: Normal)   Pulse 72   Temp 98.1 F (36.7 C) (Temporal)   Resp 12   Wt 155 lb (70.3 kg)   LMP 12/26/2018   BMI 25.40 kg/m     General appearance: alert, cooperative and appears stated age Abdomen: soft, non-tender; bowel sounds normal; no masses,  no organomegaly Lymph:  no inguinal LAD noted  Pelvic: External genitalia:  no lesions              Urethra:  normal appearing urethra with no masses, tenderness or lesions              Bartholins and Skenes: normal                 Vagina: normal appearing vagina with normal color and discharge, no lesions              Cervix: no lesions and IUD string noted              Bimanual Exam:  Uterus:  normal size, contour, position, consistency, mobility, non-tender              Adnexa: no mass, fullness, tenderness  Chaperone was present for exam.  Assessment: S/p paraguard placement with increase in amount and length of flow PMDD  Plan: She is going to monitor bleeding for the next two months but  if it continues like it is, she will want the paragard IUD removed Fluoxetine 10mg  days 15-28 each month.  Rx to pharmacy.  She will call and give update in 2-3 months if decides to start medication.   ~15 minutes spent with patient >50% of time was in face to face discussion of above.

## 2020-02-09 ENCOUNTER — Ambulatory Visit: Payer: BC Managed Care – PPO

## 2020-02-15 DIAGNOSIS — Z1152 Encounter for screening for COVID-19: Secondary | ICD-10-CM | POA: Diagnosis not present

## 2020-08-15 DIAGNOSIS — H5711 Ocular pain, right eye: Secondary | ICD-10-CM | POA: Diagnosis not present

## 2020-08-23 ENCOUNTER — Ambulatory Visit (INDEPENDENT_AMBULATORY_CARE_PROVIDER_SITE_OTHER): Payer: BC Managed Care – PPO | Admitting: Neurology

## 2020-08-23 ENCOUNTER — Other Ambulatory Visit: Payer: Self-pay

## 2020-08-23 ENCOUNTER — Telehealth: Payer: Self-pay | Admitting: Neurology

## 2020-08-23 ENCOUNTER — Encounter: Payer: Self-pay | Admitting: Neurology

## 2020-08-23 VITALS — BP 114/73 | HR 69 | Ht 66.0 in | Wt 156.0 lb

## 2020-08-23 DIAGNOSIS — H469 Unspecified optic neuritis: Secondary | ICD-10-CM | POA: Diagnosis not present

## 2020-08-23 DIAGNOSIS — E559 Vitamin D deficiency, unspecified: Secondary | ICD-10-CM | POA: Diagnosis not present

## 2020-08-23 DIAGNOSIS — H4710 Unspecified papilledema: Secondary | ICD-10-CM | POA: Diagnosis not present

## 2020-08-23 NOTE — Progress Notes (Signed)
[facility]  PATIENT: Joyce [name] DOB: 1980/08/03  REFERRING DOCTOR OR PCP: Bunnie Pion, OD; Sunnie Nielsen (PCP) SOURCE: Patient, notes from optometry.  _________________________________   HISTORICAL  CHIEF COMPLAINT:  Chief Complaint  Patient presents with   New Patient (Initial Visit)    Rm 2, with husband. Right eye pain has gotten better. R eye pain started 3 weeks. Denies any imaging.    HISTORY OF PRESENT ILLNESS:  I had the pleasure of seeing your patient, Joyce [name], at [facility] for neurologic consultation regarding her right eye pain and visual changes and concern about optic neuritis.  She is a 40 year old woman who had the onset of eye pain June 30th.  Pain peaked about a week later and then began to improve.   Movement of the eye worsened the pain.    By 08/15/2020, pain was only occurring on far lateral gaze.  That day, she saw Dr. Karie Soda.  The dilated eye exam showed a slightly elevated/swollen right optic nerve.  This was confirmed with OCT testing.  At no time did she notice any change in visual acuity, visual field or color vision..      She denies any history of numbness, clumsiness, weakness or visual change consistent with demyelination.  She has no neuro-imaging studies.      She is otherwise healthy and takes no medications except for multivitamins.    She has no personal history of an autoimmune disorder.  No family history of MS or other autoimmune disorders.  REVIEW OF SYSTEMS: Constitutional: No fevers, chills, sweats, or change in appetite Eyes: No visual changes, double vision, eye pain Ear, nose and throat: No hearing loss, ear pain, nasal congestion, sore throat Cardiovascular: No chest pain, palpitations Respiratory:  No shortness of breath at rest or with exertion.   No wheezes GastrointestinaI: No nausea, vomiting, diarrhea, abdominal pain, fecal incontinence Genitourinary:  No dysuria, urinary  retention or frequency.  No nocturia. Musculoskeletal:  No neck pain, back pain Integumentary: No rash, pruritus, skin lesions Neurological: as above Psychiatric: No depression at this time.  No anxiety Endocrine: No palpitations, diaphoresis, change in appetite, change in weigh or increased thirst Hematologic/Lymphatic:  No anemia, purpura, petechiae. Allergic/Immunologic: No itchy/runny eyes, nasal congestion, recent allergic reactions, rashes  ALLERGIES: No Known Allergies  HOME MEDICATIONS:  Current Outpatient Medications:    Multiple Vitamin (MULTIVITAMIN) tablet, Take 1 tablet by mouth daily., Disp: , Rfl:    PARAGARD INTRAUTERINE COPPER IU, by Intrauterine route., Disp: , Rfl:   PAST MEDICAL HISTORY: No past medical history on file.  PAST SURGICAL HISTORY: Past Surgical History:  Procedure Laterality Date   DILATION AND CURETTAGE OF UTERUS  2011    FAMILY HISTORY: Family History  Problem Relation Age of Onset   Diabetes Mother    Colon polyps Mother    Hyperlipidemia Father    High blood pressure Father    Breast cancer Paternal Grandmother        Post menopause   Heart attack Paternal Grandfather     SOCIAL HISTORY:  Social History   Socioeconomic History   Marital status: Married    Spouse name: Not on file   Number of children: 2   Years of education: Not on file   Highest education level: Not on file  Occupational History   Not on file  Tobacco Use   Smoking status: Never   Smokeless tobacco: Never  Vaping Use   Vaping Use: Never used  Substance  and Sexual Activity   Alcohol use: Yes    Comment: occasionally    Drug use: Never   Sexual activity: Yes    Birth control/protection: Condom, I.U.D.    Comment: Paragard IUD inserted 11/27/18  Other Topics Concern   Not on file  Social History Narrative   Not on file   Social Determinants of Health   Financial Resource Strain: Not on file  Food Insecurity: Not on file  Transportation Needs:  Not on file  Physical Activity: Not on file  Stress: Not on file  Social Connections: Not on file  Intimate Partner Violence: Not on file     PHYSICAL EXAM  Vitals:   08/23/20 0847  BP: 114/73  Pulse: 69  Weight: 156 lb (70.8 kg)  Height: 5\' 6"  (1.676 m)    Body mass index is 25.18 kg/m.  Visual acuity: OD 20/25 OS 20/20 Symmetric color vision  General: The patient is well-developed and well-nourished and in no acute distress  HEENT:  Head is Joyce [name].  Sclera are anicteric.  Funduscopic exam showed a normal optic disc on the left and mild optic nerve edema on the right.  Venous pulsations were present..  Neck: No carotid bruits are noted.  The neck is nontender.  Cardiovascular: The heart has a regular rate and rhythm with a normal S1 and S2. There were no murmurs, gallops or rubs.    Skin: Extremities are without rash or  edema.  Musculoskeletal:  Back is nontender  Neurologic Exam  Mental status: The patient is alert and oriented x 3 at the time of the examination. The patient has apparent normal recent and remote memory, with an apparently normal attention span and concentration ability.   Speech is normal.  Cranial nerves: Extraocular movements are full.  She had a mild afferent pupillary defect on the right.  Visual fields are full.  Facial symmetry is present. There is good facial sensation to soft touch bilaterally.Facial strength is normal.  Trapezius and sternocleidomastoid strength is normal. No dysarthria is noted.  No obvious hearing deficits are noted.  Motor:  Muscle bulk is normal.   Tone is normal. Strength is  5 / 5 in all 4 extremities.   Sensory: Sensory testing is intact to pinprick, soft touch and vibration sensation in all 4 extremities.  Coordination: Cerebellar testing reveals good finger-nose-finger and heel-to-shin bilaterally.  Gait and station: Station is normal.   Gait is normal. Tandem gait is normal. Romberg is negative.   Reflexes:  Deep tendon reflexes are symmetric and normal bilaterally.   Plantar responses are flexor.    DIAGNOSTIC DATA (LABS, IMAGING, TESTING) - I reviewed patient records, labs, notes, testing and imaging myself where available.  Lab Results  Component Value Date   WBC 5.3 11/17/2018   HGB 12.4 11/17/2018   HCT 37.8 11/17/2018   MCV 87.9 11/17/2018   PLT 207 11/17/2018      Component Value Date/Time   NA 136 11/17/2018 1155   K 3.9 11/17/2018 1155   CL 104 11/17/2018 1155   CO2 24 11/17/2018 1155   GLUCOSE 83 11/17/2018 1155   BUN 11 11/17/2018 1155   CREATININE 0.87 11/17/2018 1155   CALCIUM 9.3 11/17/2018 1155   PROT 8.1 11/17/2018 1155   ALBUMIN 4.4 07/06/2016 0000   AST 16 11/17/2018 1155   ALT 15 11/17/2018 1155   BILITOT 0.5 11/17/2018 1155   GFRNONAA 85 11/17/2018 1155   GFRAA 98 11/17/2018 1155   Lab Results  Component Value Date   CHOL 149 11/17/2018   HDL 59 11/17/2018   LDLCALC 74 11/17/2018   TRIG 76 11/17/2018   CHOLHDL 2.5 11/17/2018   Lab Results  Component Value Date   HGBA1C 4.9 07/06/2016   No results found for: VITAMINB12 Lab Results  Component Value Date   TSH 1.57 07/06/2016       ASSESSMENT AND PLAN  Right optic neuritis - Plan: MR BRAIN W WO CONTRAST, MR ORBITS W WO CONTRAST, ANA w/Reflex, Sedimentation rate, C-reactive protein, Neuromyelitis optica autoab, IgG, Thyroid Panel With TSH, Angiotensin converting enzyme  Optic nerve edema - Plan: MR BRAIN W WO CONTRAST, MR ORBITS W WO CONTRAST, ANA w/Reflex, Sedimentation rate, C-reactive protein, Neuromyelitis optica autoab, IgG, Thyroid Panel With TSH, Angiotensin converting enzyme  Vitamin D deficiency - Plan: VITAMIN D 25 Hydroxy (Vit-D Deficiency, Fractures), Thyroid Panel With TSH, Angiotensin converting enzyme   In summary, Joyce [name] is a 40 year old woman who had the onset of eye pain associated with optic nerve edema 08/04/2020.  Pain peaked about a week later and has almost  completely resolved.  At no time did she notice any visual acuity changes, visual field defect or change in color vision.    She most likely has optic neuritis on the right though her presentation is not typical.  We spent time discussing the association between optic neuritis and multiple sclerosis in people of her age and I discussed findings of the optic nerve treatment trial and the percentages of conversion..  We will check an MRI of the brain and orbits to assess for the possibility of demyelination and also to rule out a compressive orbital cause of her symptoms.  We will check blood work for autoimmune and inflammatory disorders and also check TSH and vitamin D.  We will call her with the results of the MRI.  If very consistent with MS we may initiate treatment.  If mildly abnormal, we may need to determine if the findings are consistent with demyelination.  If normal or practically normal, we will reimage in about 7 or 8 months (and possibly need to reimage again about a year later) to determine if there are changes over time consistent with MS.  Thank you for asking me to see Joyce [name].  Please let me know if I can be of further assistance with her or other patients in the future.      Enrico Eaddy A. Epimenio Foot, MD, [facility] 08/23/2020, 9:14 AM Certified in Neurology, Clinical Neurophysiology, Sleep Medicine and Neuroimaging  [facility] [address] [place], [place] [address] [phone]

## 2020-08-23 NOTE — Telephone Encounter (Signed)
MRI brain w/wo contrast & MRI orbits w/wo contrast Joyce [name]: [id number] (08/23/20-09/21/20)  Sent to GI for scheduling

## 2020-08-26 LAB — THYROID PANEL WITH TSH
Free Thyroxine Index: 2 (ref 1.2–4.9)
T3 Uptake Ratio: 24 % (ref 24–39)
T4, Total: 8.2 ug/dL (ref 4.5–12.0)
TSH: 1.82 u[IU]/mL (ref 0.450–4.500)

## 2020-08-26 LAB — ENA+DNA/DS+SJORGEN'S
ENA RNP Ab: 0.2 AI (ref 0.0–0.9)
ENA SM Ab Ser-aCnc: <0.2 AI (ref 0.0–0.9)
ENA SSA (RO) Ab: >8 AI — ABNORMAL HIGH (ref 0.0–0.9)
ENA SSB (LA) Ab: >8 AI — ABNORMAL HIGH (ref 0.0–0.9)
dsDNA Ab: 1 IU/mL (ref 0–9)

## 2020-08-26 LAB — SEDIMENTATION RATE: Sed Rate: 23 mm/hr (ref 0–32)

## 2020-08-26 LAB — ANGIOTENSIN CONVERTING ENZYME: Angio Convert Enzyme: 41 U/L (ref 14–82)

## 2020-08-26 LAB — C-REACTIVE PROTEIN: CRP: <1 mg/L (ref 0–10)

## 2020-08-26 LAB — NEUROMYELITIS OPTICA AUTOAB, IGG: NMO IgG Autoantibodies: <1.5 U/mL (ref 0.0–3.0)

## 2020-08-26 LAB — ANA W/REFLEX: Anti Nuclear Antibody (ANA): POSITIVE — AB

## 2020-08-26 LAB — VITAMIN D 25 HYDROXY (VIT D DEFICIENCY, FRACTURES): Vit D, 25-Hydroxy: 37.7 ng/mL (ref 30.0–100.0)

## 2020-09-03 ENCOUNTER — Ambulatory Visit
Admission: RE | Admit: 2020-09-03 | Discharge: 2020-09-03 | Disposition: A | Discharge status: Home / Self Care | Payer: BC Managed Care – PPO | Source: Ambulatory Visit | Attending: Neurology | Admitting: Neurology

## 2020-09-03 ENCOUNTER — Other Ambulatory Visit: Payer: Self-pay

## 2020-09-03 DIAGNOSIS — H4710 Unspecified papilledema: Secondary | ICD-10-CM

## 2020-09-03 DIAGNOSIS — H469 Unspecified optic neuritis: Secondary | ICD-10-CM | POA: Diagnosis not present

## 2020-09-03 MED ORDER — GADOBENATE DIMEGLUMINE 529 MG/ML IV SOLN
20.0000 mL | Freq: Once | INTRAVENOUS | Status: AC | PRN
  Administered 2020-09-03: 20 mL via INTRAVENOUS

## 2020-09-05 ENCOUNTER — Telehealth: Payer: Self-pay | Admitting: Neurology

## 2020-09-05 DIAGNOSIS — H469 Unspecified optic neuritis: Secondary | ICD-10-CM

## 2020-09-05 DIAGNOSIS — R894 Abnormal immunological findings in specimens from other organs, systems and tissues: Secondary | ICD-10-CM

## 2020-09-05 NOTE — Telephone Encounter (Signed)
I saw her recently for optic neuritis.  Lab work was abnormal showing elevated SSA and SSB.  This is potentially concerning for Sjogren's disease.  She does not report dry mouth or dry eyes.  The MRI of the brain was essentially normal (minimal cerebellar ectopia would not be clinically relevant).  MRI of the orbits that showed asymmetry of the lacrimal gland, larger on the right and top normal in size.  I spoke with her to go over the results.  I would like her to see rheumatology for further evaluation.

## 2020-09-05 NOTE — Telephone Encounter (Signed)
Referral for rheumatology sent to [facility]. P: [phone].

## 2020-09-12 DIAGNOSIS — H5711 Ocular pain, right eye: Secondary | ICD-10-CM | POA: Diagnosis not present

## 2020-10-07 DIAGNOSIS — H469 Unspecified optic neuritis: Secondary | ICD-10-CM | POA: Insufficient documentation

## 2020-10-11 ENCOUNTER — Telehealth: Payer: Self-pay | Admitting: Neurology

## 2020-10-11 ENCOUNTER — Encounter: Payer: Self-pay | Admitting: Internal Medicine

## 2020-10-11 DIAGNOSIS — H4710 Unspecified papilledema: Secondary | ICD-10-CM | POA: Diagnosis not present

## 2020-10-11 NOTE — Telephone Encounter (Signed)
Pt states since 09-02 the exact pain she had in her right eye, she now has in her left.

## 2020-10-11 NOTE — Telephone Encounter (Signed)
Took call from phone staff and spoke w/ pt. Pain in left eye started on 10/07/20. Pain w/ movement started 10/08/20. Pain is constant. Movement makes pain worse. No injuries. No infections. No new meds since last seen. She has Rheumatology appt tomorrow at 9:20am w/ Dr. Sheliah Hatch in [place]. Advised I will send message to Dr. Epimenio Foot to see what he would recommend and call back.

## 2020-10-11 NOTE — Telephone Encounter (Signed)
Informed the patient of what Dr. Epimenio Foot advised and she was appreciative for the call back. She will call us if she needs [place] to call in the dose pack recommended by Dr Epimenio Foot.

## 2020-10-11 NOTE — Telephone Encounter (Signed)
Called pt back, went to VM. LVM for her to call office back.

## 2020-10-12 ENCOUNTER — Ambulatory Visit (INDEPENDENT_AMBULATORY_CARE_PROVIDER_SITE_OTHER): Payer: BC Managed Care – PPO | Admitting: Internal Medicine

## 2020-10-12 ENCOUNTER — Other Ambulatory Visit: Payer: Self-pay

## 2020-10-12 ENCOUNTER — Encounter: Payer: Self-pay | Admitting: Internal Medicine

## 2020-10-12 VITALS — BP 104/74 | HR 74 | Resp 12 | Ht 66.0 in | Wt 153.6 lb

## 2020-10-12 DIAGNOSIS — R768 Other specified abnormal immunological findings in serum: Secondary | ICD-10-CM

## 2020-10-12 DIAGNOSIS — M3500 Sicca syndrome, unspecified: Secondary | ICD-10-CM | POA: Insufficient documentation

## 2020-10-12 DIAGNOSIS — H469 Unspecified optic neuritis: Secondary | ICD-10-CM

## 2020-10-12 DIAGNOSIS — R220 Localized swelling, mass and lump, head: Secondary | ICD-10-CM | POA: Diagnosis not present

## 2020-10-12 NOTE — Progress Notes (Signed)
Office Visit Note  Patient: Joyce [name]             Date of Birth: Aug 06, 1980           MRN: [id number]             PCP: Emeterio Reeve, DO Referring: Britt Bottom, MD Visit Date: 10/12/2020 Occupation: Engineer, drilling  Subjective:   History of Present Illness: Joyce [name] is a 40 y.o. female here for evaluation of positive autoimmune serology checked in association with optic neuritis and mild asymmetric lacrimal gland size.  Symptoms started around July with right eye pain particularly with movement after symptoms persisted for 10 days he went for evaluation.  We will send to her work-up including eye exam demonstrating swelling around the optic nerve and MRI that was largely unremarkable possible small lacrimal gland enlargement.  She did not start any particular treatments or intervention for this symptoms did improve.  She started having symptoms on the left side last week and went for eye exam on Friday felt the findings were consistent with those on the right but at earlier stage.  Besides this she did have an episode of right cheek and jaw pain a week ago improved spontaneously without treatment.  She has noticed new headaches typically start posteriorly and are exacerbated with forward bending leading to a bilateral frontal pounding type of pain.  She denies any sensation of dry eyes or itching does sometimes feel a dry mouth.  Labs reviewed 08/2020 ANA pos SSA >8.0 SSB >8.0 dsDNA, RNP, SM neg ESR 23 CRP <1 ACE 41 TSH wnl NMO IgG neg Vit D 37.7  Imaging reviewed 09/03/20 MRI Orbits IMPRESSION: This MRI of the orbits with and without contrast shows the following: 1.   No acute findings.  Normal enhancement. 2.   Optic nerves appear normal.   3.   Mild asymmetry of the lacrimal glands, larger on the right.  This could be incidental as the dimensions of the glands are within normal limits.  They have normal signal.   Review of Systems  HENT:   Positive for mouth dryness.   Eyes:  Positive for pain. Negative for dryness.  Respiratory:  Negative for cough.   Cardiovascular:  Negative for swelling in legs/feet.  Musculoskeletal:  Negative for joint swelling.  Skin:  Negative for rash.  Neurological:  Positive for headaches.  Hematological:  Negative for bruising/bleeding tendency.   PMFS History:  Patient Active Problem List   Diagnosis Date Noted   Positive ANA (antinuclear antibody) 10/12/2020   Facial swelling 10/12/2020   Left optic neuritis 10/07/2020   Right optic neuritis 08/23/2020   Optic nerve edema 08/23/2020   Activities involving scuba diving 06/14/2017    Past Medical History:  Diagnosis Date   Optic neuritis     Family History  Problem Relation Age of Onset   Diabetes Mother    Colon polyps Mother    Hyperlipidemia Father    High blood pressure Father    Breast cancer Paternal Grandmother        Post menopause   Heart attack Paternal Grandfather    Past Surgical History:  Procedure Laterality Date   DILATION AND CURETTAGE OF UTERUS  2011   Social History   Social History Narrative   Not on file   Immunization History  Administered Date(s) Administered   Influenza,inj,Quad PF,6+ Mos 09/15/2018, 09/15/2018   Tdap 11/05/2016     Objective: Vital Signs: BP 104/74 (BP  Location: Right Arm, Patient Position: Sitting, Cuff Size: Large)   Pulse 74   Resp 12   Ht 5' 6" (1.676 m)   Wt 153 lb 9.6 oz (69.7 kg)   BMI 24.79 kg/m    Physical Exam HENT:     Right Ear: External ear normal.     Left Ear: External ear normal.     Mouth/Throat:     Mouth: Mucous membranes are moist.     Pharynx: Oropharynx is clear.  Eyes:     Conjunctiva/sclera: Conjunctivae normal.  Cardiovascular:     Rate and Rhythm: Normal rate and regular rhythm.  Pulmonary:     Effort: Pulmonary effort is normal.     Breath sounds: Normal breath sounds.  Skin:    General: Skin is warm and dry.     Findings: No rash.   Neurological:     General: No focal deficit present.     Mental Status: She is alert.     Deep Tendon Reflexes: Reflexes normal.  Psychiatric:        Mood and Affect: Mood normal.     Musculoskeletal Exam:  Neck full ROM no tenderness Shoulders full ROM no tenderness or swelling Elbows full ROM no tenderness or swelling Wrists full ROM no tenderness or swelling Fingers full ROM no tenderness or swelling Knees full ROM no tenderness or swelling Ankles full ROM no tenderness or swelling  Investigation: No additional findings.  Imaging: No results found.  Recent Labs: Lab Results  Component Value Date   WBC 4.8 10/12/2020   HGB 12.1 10/12/2020   PLT 254 10/12/2020   NA 136 11/17/2018   K 3.9 11/17/2018   CL 104 11/17/2018   CO2 24 11/17/2018   GLUCOSE 83 11/17/2018   BUN 11 11/17/2018   CREATININE 0.87 11/17/2018   BILITOT 0.5 11/17/2018   AST 16 11/17/2018   ALT 15 11/17/2018   PROT 8.1 11/17/2018   ALBUMIN 4.4 07/06/2016   CALCIUM 9.3 11/17/2018   GFRAA 98 11/17/2018    Speciality Comments: No specialty comments available.  Procedures:  No procedures performed Allergies: Patient has no known allergies.   Assessment / Plan:     Visit Diagnoses: Positive ANA (antinuclear antibody) - Plan: C3 and C4, Rheumatoid factor, IgG, IgA, IgM, IGG SUBCLASS 4, CBC with Differential/Platelet  Serology consistent with probably primary Sjogren's syndrome with her reported symptoms.  We will check additional serology for indications of disease activity or prognostic risk with complements, rheumatoid factor, serum immunoglobulins, and CBC.  She is not currently describing significant systemic or generalized symptoms today we will check again at follow-up.  Right optic neuritis Left optic neuritis - Plan: C3 and C4, Rheumatoid factor, IgG, IgA, IgM, IGG SUBCLASS 4, CBC with Differential/Platelet  Bilateral optic neuritis based on optometry exams no findings on MRI is unusual  presenting symptom but has been described in primary Sjogren's syndrome.  No supporting evidence for alternate cause such as sarcoidosis at this time.  May be beneficial for repeat exam with ophthalmology also whether any localized treatment would be useful.  She has ongoing neurology follow-up plan for repeat neuroimaging fortunately initial was negative for any evidence of MS.  Facial swelling  Lab reported facial swelling could be consistent with major salivary gland inflammation though nothing is present today.  Will be beneficial if symptoms do return to examine ultrasound inspection might show cystic changes consistent with Sjogren's related sialoadenitis.  We will also check for serum IgG subclass 4   level.  Orders: Orders Placed This Encounter  Procedures   C3 and C4   Rheumatoid factor   IgG, IgA, IgM   IGG SUBCLASS 4   CBC with Differential/Platelet    No orders of the defined types were placed in this encounter.    Follow-Up Instructions: Return in about 3 weeks (around 11/02/2020).   Collier Salina, MD  Note - This record has been created using [employer].  Chart creation errors have been sought, but may not always  have been located. Such creation errors do not reflect on  the standard of medical care.

## 2020-10-12 NOTE — Patient Instructions (Signed)
Labs today  Antinuclear Antibody Test Why am I having this test? This is a test that is used to help diagnose systemic lupus erythematosus (SLE) and other autoimmune diseases. An autoimmune disease is a disease in which the body's own defense (immune)system attacks its organs. What is being tested? This test checks for antinuclear antibodies (ANA) in the blood. The presence of ANA is associated with several autoimmune diseases. It is seen in almost all patients with lupus. What kind of sample is taken? A blood sample is required for this test. It is usually collected by inserting a needle into a blood vessel. How are the results reported? Your test results will be reported as either positive or negative. A false-positive result can occur. A false positive is incorrect because it means that a condition is present when it is not. What do the results mean? A positive test result may mean that you have: Lupus. Other autoimmune diseases, such as rheumatoid arthritis, scleroderma, or Sjgren syndrome. Conditions that may cause a false-positive result include: Liver dysfunction. Myasthenia gravis. Infectious mononucleosis. Talk with your health care provider about what your results mean. Questions to ask your health care provider Ask your health care provider, or the department that is doing the test: When will my results be ready? How will I get my results? What are my treatment options? What other tests do I need? What are my next steps? Summary This is a test that is used to help diagnose systemic lupus erythematosus (SLE) and other autoimmune diseases. An autoimmune disease is a disease in which the body's own defense (immune)system attacks the body. This test checks for antinuclear antibodies (ANA) in the blood. The presence of ANA is associated with several autoimmune diseases. It is seen in almost all patients with lupus. Your test results will be reported as either positive or  negative. Talk with your health care provider about what your results mean.  Complement Assay Test Why am I having this test? Complement refers to a group of proteins that are part of the body's disease-fighting system (immune system). A complement assay test provides information about some or all of these proteins. You may have this test: To diagnose a lack, or deficiency, of certain complement proteins. Deficiencies can be passed from parent to child (inherited). To monitor an infection or autoimmune disease. If you have unexplained inflammation or swelling (edema). If you have bacterial infections again and again. What is being tested? This test can be used to measure: Total complement. This is the total number of protein complements in your blood. The number of each kind of complement in your blood. The nine main kinds of complement are labeled C1 through C9. Some of these complements, such as C3 and C4, are especially important and have many functions in the body. Depending on why you are having the test, your health care provider may test your total complement or only some individual complements, such as C3 and C4. The total complement assay test may be done before individual complements are tested. What kind of sample is taken? A blood sample is required for this test. It is usually collected by inserting a needle into a blood vessel. Tell a health care provider about: Any allergies you have. All medicines you are taking, including vitamins, herbs, eye drops, creams, and over-the-counter medicines. Any blood disorders you have. Any surgeries you have had. Any medical conditions you have. Whether you are pregnant or may be pregnant. How are the results reported? Your results  will be reported as a value that tells you how much complement is in your blood. This will be given as units per milliliter of blood (units/mL) or as milligrams per deciliter of blood (mg/dL). Your results may be  reported as total complement, or as individual complements, or both. Your health care provider will compare your results to normal ranges that were established after testing a large group of people (reference ranges). Reference ranges may vary among labs and hospitals. For this test, reference ranges for some of the most commonly measured complement assays may be: Total complement: 30-75 units/mL. C2: 1-4 mg/dL. C3: 75-175 mg/dL. C4: 22-45 units/mL. What do the results mean? Results within reference ranges are considered normal, which means you have a normal amount of complement in your blood. Results that are higher than the reference ranges may be caused by: Inflammatory disease. Heart attack. Cancer. Complement deficiencies, or results lower than the reference ranges, may be caused by: Certain inherited conditions. Autoimmune disease. Certain liver diseases. Malnutrition. Certain types of anemia that result in breakdown of red blood cells (hemolytic anemia). Talk with your health care provider about what your results mean. Questions to ask your health care provider Ask your health care provider, or the department that is doing the test: When will my results be ready? How will I get my results? What are my treatment options? What other tests do I need? What are my next steps? Summary Complement refers to a group of proteins that are part of the body's disease-fighting system (immune system). A complement assay test can provide information about some or all of these proteins. You may have a complement assay test to help diagnose a complement deficiency, and to monitor some infections or autoimmune disease. Talk with your health care provider about what your results mean.  Immunoglobulin Quantification Test Why am I having this test? The immunoglobulin (Ig) quantification test is used to detect and monitor various diseases, including infections, chronic liver disease, some cancers,  autoimmune diseases, and acquired immunodeficiency syndrome (AIDS). What is being tested? This test checks for the concentration of immune system proteins (antibodies) called immunoglobulins in the blood. They include IgG, IgM, IgA, IgD, and IgE. Immunoglobulin levels may increase for a number of reasons, including the presence of certain cancers. In these types of cancer, the cells that produce immunoglobulins (plasma cells) divide rapidly and release more immunoglobulins. Decreased immunoglobulin levels are often found in people with a deficiency in their immune system that could be due to a disease or treatment for a disease. What kind of sample is taken? A blood sample is required for this test. It is usually collected by inserting a needle into a blood vessel. How are the results reported? Your test results will be reported as values. Your health care provider will compare your results to normal ranges that were established after testing a large group of people (reference ranges). Reference ranges may vary among labs and hospitals. For this test, common reference ranges are: Immunoglobulin G (IgG). Adults: 565-1,765 mg/dL. Children: 250-1,600 mg/dL. Immunoglobulin A (IgA). Adults: 85-385 mg/dL. Children: 1-350 mg/dL. Immunoglobulin M (IgM). Adults: 55-375 mg/dL. Children: 20-200 mg/dL. Immunoglobulin D (IgD) and Immunoglobulin E (IgE). Minimal. What do the results mean? Levels of IgG that are higher than the reference range may indicate: Different infections. Autoimmune diseases. Chronic liver disease. Levels of IgG that are lower than the reference range may be associated with: AIDS. Different types of cancer. Various causes of suppressed immunity, including medications or treatments  for diseases such as cancer. Levels of IgA that are higher than the reference range may indicate: Chronic liver diseases. Chronic infections. Levels of IgA that are lower than the reference range may  be associated with: Various causes of immunoglobulin deficiency, including medications or treatments for diseases such as cancer. Levels of IgM that are higher than the reference range may indicate: Certain rare cancers. Different infections. Autoimmune diseases. Chronic liver conditions. Levels of IgM that are lower than the reference range may be associated with: AIDS. Various causes of immunoglobulin deficiency. Various causes of suppressed immunity, including medications or treatments for diseases such as cancer. Levels of IgE that are higher than the reference range may indicate: Allergic reactions or allergic infections. Levels of IgE that are lower than the reference range may indicate: Inherited immunoglobulin deficiency. Talk with your health care provider about what your results mean. Questions to ask your health care provider Ask your health care provider, or the department that is doing the test: When will my results be ready? How will I get my results? What are my treatment options? What other tests do I need? What are my next steps? Summary The immunoglobulin quantification test is performed to detect and monitor various diseases. Immunoglobulins (Ig) are a type of antibody in the blood. They include IgG, IgM, IgA, IgD, and IgE. The levels of these antibodies may increase due to a number of conditions, such as in certain cancers. The levels of these antibodies may decrease because of a problem in the immune system. Talk with your health care provider about what your results may mean. This information is not intended to replace advice given to you by your health care provider. Make sure you discuss any questions you have with your health care provider. Document Revised: 09/25/2019 Document Reviewed: 09/25/2019 Elsevier Patient Education  2022 [employer].

## 2020-10-16 LAB — CBC WITH DIFFERENTIAL/PLATELET
Absolute Monocytes: 470 cells/uL (ref 200–950)
Basophils Absolute: 48 cells/uL (ref 0–200)
Basophils Relative: 1 %
Eosinophils Absolute: 38 cells/uL (ref 15–500)
Eosinophils Relative: 0.8 %
HCT: 36.9 % (ref 35.0–45.0)
Hemoglobin: 12.1 g/dL (ref 11.7–15.5)
Lymphs Abs: 1397 cells/uL (ref 850–3900)
MCH: 28.1 pg (ref 27.0–33.0)
MCHC: 32.8 g/dL (ref 32.0–36.0)
MCV: 85.8 fL (ref 80.0–100.0)
MPV: 10.7 fL (ref 7.5–12.5)
Monocytes Relative: 9.8 %
Neutro Abs: 2846 cells/uL (ref 1500–7800)
Neutrophils Relative %: 59.3 %
Platelets: 254 10*3/uL (ref 140–400)
RBC: 4.3 10*6/uL (ref 3.80–5.10)
RDW: 12 % (ref 11.0–15.0)
Total Lymphocyte: 29.1 %
WBC: 4.8 10*3/uL (ref 3.8–10.8)

## 2020-10-16 LAB — IGG, IGA, IGM
IgG (Immunoglobin G), Serum: 3800 mg/dL — ABNORMAL HIGH (ref 600–1640)
IgM, Serum: 229 mg/dL (ref 50–300)
Immunoglobulin A: 185 mg/dL (ref 47–310)

## 2020-10-16 LAB — RHEUMATOID FACTOR: Rheumatoid fact SerPl-aCnc: 66 IU/mL — ABNORMAL HIGH (ref <14)

## 2020-10-16 LAB — C3 AND C4
C3 Complement: 142 mg/dL (ref 83–193)
C4 Complement: 12 mg/dL — ABNORMAL LOW (ref 15–57)

## 2020-10-16 LAB — IGG SUBCLASS 4: IgG Subclass 4: 97.9 mg/dL — ABNORMAL HIGH (ref 4.0–86.0)

## 2020-11-01 NOTE — Progress Notes (Signed)
Office Visit Note  Patient: Joyce [name]             Date of Birth: 26-Jan-1981           MRN: [id number]             PCP: Emeterio Reeve, DO Referring: Emeterio Reeve, DO Visit Date: 11/02/2020   Subjective:   History of Present Illness: Joyce [name] is a 40 y.o. female here for follow up for sjogren's syndrome with xerostomia and with bilateral optic neuritis. Since our last visit she experienced one day of being unable to see due to severely blurry vision and could not go to work. This improved spontaneously after one day. She has some persistent eye redness but denies feeling dry or painful. Her mouth remains very dry with alterations in taste such as pain with salty and sour foods. No recurrence of swelling of facial glands.  Previous HPI Joyce [name] is a 40 y.o. female here for evaluation of positive autoimmune serology checked in association with optic neuritis and mild asymmetric lacrimal gland size.  Symptoms started around July with right eye pain particularly with movement after symptoms persisted for 10 days he went for evaluation.  We will send to her work-up including eye exam demonstrating swelling around the optic nerve and MRI that was largely unremarkable possible small lacrimal gland enlargement.  She did not start any particular treatments or intervention for this symptoms did improve.  She started having symptoms on the left side last week and went for eye exam on Friday felt the findings were consistent with those on the right but at earlier stage.  Besides this she did have an episode of right cheek and jaw pain a week ago improved spontaneously without treatment.  She has noticed new headaches typically start posteriorly and are exacerbated with forward bending leading to a bilateral frontal pounding type of pain.  She denies any sensation of dry eyes or itching does sometimes feel a dry mouth.   Labs reviewed 08/2020 ANA pos SSA >8.0 SSB >8.0 dsDNA, RNP,  SM neg ESR 23 CRP <1 ACE 41 TSH wnl NMO IgG neg Vit D 37.7   Imaging reviewed 09/03/20 MRI Orbits IMPRESSION: This MRI of the orbits with and without contrast shows the following: 1.   No acute findings.  Normal enhancement. 2.   Optic nerves appear normal.   3.   Mild asymmetry of the lacrimal glands, larger on the right.  This could be incidental as the dimensions of the glands are within normal limits.  They have normal signal.  Review of Systems  Constitutional:  Negative for fatigue.  HENT:  Negative for mouth sores, mouth dryness and nose dryness.   Eyes:  Negative for pain, itching and dryness.  Respiratory:  Negative for shortness of breath and difficulty breathing.   Cardiovascular:  Negative for chest pain and palpitations.  Gastrointestinal:  Negative for blood in stool, constipation and diarrhea.  Endocrine: Negative for increased urination.  Genitourinary:  Negative for difficulty urinating.  Musculoskeletal:  Negative for joint pain, joint pain, joint swelling, myalgias, morning stiffness, muscle tenderness and myalgias.  Skin:  Negative for color change, rash and redness.  Allergic/Immunologic: Negative for susceptible to infections.  Neurological:  Negative for dizziness, numbness, headaches, memory loss and weakness.  Hematological:  Negative for bruising/bleeding tendency.  Psychiatric/Behavioral:  Negative for confusion.    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   Activities involving scuba diving 06/14/2017    Past Medical History:  Diagnosis Date   Optic neuritis     Family History  Problem Relation Age of Onset   Diabetes Mother    Colon polyps Mother    Hyperlipidemia Father    High blood pressure Father    Breast cancer Paternal Grandmother         Post menopause   Heart attack Paternal Grandfather    Past Surgical History:  Procedure Laterality Date   DILATION AND CURETTAGE OF UTERUS  2011   Social History   Social History Narrative   Not on file   Immunization History  Administered Date(s) Administered   Influenza,inj,Quad PF,6+ Mos 09/15/2018, 09/15/2018   Tdap 11/05/2016     Objective: Vital Signs: BP 100/68 (BP Location: Left Arm, Patient Position: Sitting, Cuff Size: Normal)   Pulse 61   Ht _0  (1.676 m)   Wt 154 lb 12.8 oz (70.2 kg)   BMI 24.99 kg/m    Physical Exam HENT:     Mouth/Throat:     Mouth: Mucous membranes are dry.     Comments: Decrease salivary pooling Erythematous tongue surface with enlarged papillae Small patch of brown discoloration Good dentition Eyes:     Comments: Mild redness bilaterally No palpable lacrimal gland enlargement No periorbital edema Small pingueculae medial of cornea on both eyes Normal ocular movement  Skin:    General: Skin is warm and dry.     Findings: No rash.  Neurological:     Mental Status: She is alert.     Musculoskeletal Exam:  Elbows full ROM no tenderness or swelling Wrists full ROM no tenderness or swelling Fingers full ROM no tenderness or swelling   Investigation: No additional findings.  Imaging: No results found.  Recent Labs: Lab Results  Component Value Date   WBC 4.8 10/12/2020   HGB 12.1 10/12/2020   PLT 254 10/12/2020   NA 136 11/17/2018   K 3.9 11/17/2018   CL 104 11/17/2018   CO2 24 11/17/2018   GLUCOSE 83 11/17/2018   BUN 11 11/17/2018   CREATININE 0.87 11/17/2018   BILITOT 0.5 11/17/2018   AST 16 11/17/2018   ALT 15 11/17/2018   PROT 8.1 11/17/2018   ALBUMIN 4.4 07/06/2016   CALCIUM 9.3 11/17/2018   GFRAA 98 11/17/2018    Speciality Comments: No specialty comments available.  Procedures:  No procedures performed Allergies: Patient has no known allergies.   Assessment / Plan:     Visit Diagnoses:  Sjogren syndrome, unspecified ([place]) - Plan: Ambulatory referral to Ophthalmology  We discussed sjogren's syndrome at some length her results are all highly positive including very high IgG, positive rheumatoid factor, decreased complement C4. Currently active symptom with oral dryness and has had episodic sialadenitis. I recommend symptomatic management at this time with saliva stimulating treatments OTC. She has no skin or joint involvement at this time. Eyes appear irritated but no complaint of dryness or pain. No immunosuppression right now but request her to call back if swelling returns can consider intermittent glucocorticoids a next step.  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 Request her to see an ophthalmologist for this as her prior  assessment with O.D seemed adequate but no treatment or specific plan recommended. Will refer for urgent appointment due to ongoing episodes of vision change within past 3 weeks.  Orders: Orders Placed This Encounter  Procedures   Ambulatory referral to Ophthalmology    No orders of the defined types were placed in this encounter.    Follow-Up Instructions: Return in about 6 months (around 05/02/2021) for pSS salivary gland involvement f/u 28mo.   CCollier Salina MD  Note - This record has been created using [employer]  Chart creation errors have been sought, but may not always  have been located. Such creation errors do not reflect on  the standard of medical care.

## 2020-11-02 ENCOUNTER — Ambulatory Visit (INDEPENDENT_AMBULATORY_CARE_PROVIDER_SITE_OTHER): Payer: BC Managed Care – PPO | Admitting: Internal Medicine

## 2020-11-02 ENCOUNTER — Encounter: Payer: Self-pay | Admitting: Internal Medicine

## 2020-11-02 ENCOUNTER — Other Ambulatory Visit: Payer: Self-pay

## 2020-11-02 VITALS — BP 100/68 | HR 61 | Ht 66.0 in | Wt 154.8 lb

## 2020-11-02 DIAGNOSIS — H4710 Unspecified papilledema: Secondary | ICD-10-CM | POA: Diagnosis not present

## 2020-11-02 DIAGNOSIS — M3500 Sicca syndrome, unspecified: Secondary | ICD-10-CM | POA: Diagnosis not present

## 2020-11-02 NOTE — Patient Instructions (Addendum)
I am referring you to see another ophthalmologist to take a look for additional recommendation whether your eye involvement needs more treatment at this time.  For your dry mouth symptoms I recommend initial use of oral sugar free gum or lozenges to stimulate saliva production. You can also use OTC Biotene mouth rinse or spray for this. Pay close attention to oral hygiene as decrease saliva increases risks for tooth decay and cavities.  If symptoms are worsening noticeably or you experience repeat episode of gland swelling or pain I would like to take another look and potentially treat this inflammation.  You have some changes suggesting chronic dryness or irritation at the eye with some redness and small pinguecula present.  Sjgren's Syndrome Sjgren's syndrome is a disease in which the body's disease-fighting system (immune system) attacks the glands that produce tears (lacrimal glands) and the glands that produce saliva (salivary glands). This makes the eyes and mouth very dry. Sjgren's syndrome is a long-term (chronic) disorder that has no cure. In some cases, it is linked to other disorders (rheumatic disorders), such as rheumatoid arthritis and systemic lupus erythematosus (SLE). It may affect other parts of the body, such as the: Kidneys. Blood vessels. Joints. Lungs. Liver. Pancreas. Brain. Nerves. Spinal cord. What are the causes? The cause of this condition is not known. It may be passed along from parent to child (inherited), or it may be a symptom of a rheumatic disorder. What increases the risk? This condition is more likely to develop in: Women. People who are 69-64 years old. People who have recently had a viral infection or currently have a viral infection. What are the signs or symptoms? The main symptoms of this condition are: Dry mouth. This may include: A chalky feeling. Difficulty swallowing, speaking, or tasting. Frequent cavities in the teeth. Frequent mouth  infections. Dry eyes. This may include: Burning, redness, and itching. Blurry vision. Light sensitivity. Other symptoms may include: Dryness of the skin and the inside of the nose. Eyelid infections. Vaginal dryness, if this applies. Joint pain and stiffness. Muscle pain and stiffness. How is this diagnosed? This condition is diagnosed based on: Your symptoms. Your medical history. A physical exam of your eyes and mouth. Tests, including: A Schirmer test. This tests your tear production. An eye exam that is done with a magnifying device (slit-lamp exam). An eye test that temporarily stains your eye with dye. This shows the extent of eye damage. Tests to check your salivary gland function. Biopsy. This is a removal of part of a salivary gland from inside your lower lip to be studied under a microscope. Chest X-rays. Blood tests. Urine tests. How is this treated? There is no cure for this condition, but treatment can help you manage your symptoms. This condition may be treated with: Moisture replacement therapies to help relieve dryness in your skin, mouth, and eyes. Medicines to help relieve pain and stiffness. Medicines to help relieve inflammation in your body (corticosteroids). These are usually for severe cases. Medicines to help reduce the activity of your immune system (immunosuppressants). Surgery or insertion of plugs to close the lacrimal glands (punctal occlusion). This helps keep more natural tears in your eyes. Follow these instructions at home: Eye care  Use eye drops as told by your health care provider. Protect your eyes from the sun and wind with sunglasses or glasses. Blink at least 5-6 times a minute. Maintain properly humidified air. You may want to use a humidifier at home. Avoid smoke. Mouth care  Brush your teeth and floss after every meal. Chew sugar-free gum or suck on hard candy. This may help to relieve dry mouth. Use antimicrobial mouthwash  daily. Take frequent sips of water or sugar-free drinks. Use saliva substitutes or lip balm as told by your health care provider. Schedule and attend dentist visits every 6 months. General instructions  Take over-the-counter and prescription medicines only as told by your health care provider. Drink enough fluid to keep your urine pale yellow. Keep all follow-up visits as told by your health care provider. This is important. Contact a health care provider if: You have a fever. You have night sweats. You are always tired. You have unexplained weight loss. You develop itchy skin. You have red patches on your skin. You have a lump or swelling on your neck. Summary Sjgren's syndrome is a disease in which the body's disease-fighting system attacks the glands that produce tears and the glands that produce saliva. This condition makes the eyes and mouth very dry. Sjgren's syndrome is a long-term (chronic) disorder that has no cure. The cause of this condition is not known. There is no cure for this condition, but treatment can help you manage your symptoms.

## 2020-11-12 DIAGNOSIS — Z23 Encounter for immunization: Secondary | ICD-10-CM | POA: Diagnosis not present

## 2020-11-15 DIAGNOSIS — H30033 Focal chorioretinal inflammation, peripheral, bilateral: Secondary | ICD-10-CM | POA: Diagnosis not present

## 2020-11-15 DIAGNOSIS — H3581 Retinal edema: Secondary | ICD-10-CM | POA: Diagnosis not present

## 2020-11-15 DIAGNOSIS — H04129 Dry eye syndrome of unspecified lacrimal gland: Secondary | ICD-10-CM | POA: Diagnosis not present

## 2020-11-21 DIAGNOSIS — H30033 Focal chorioretinal inflammation, peripheral, bilateral: Secondary | ICD-10-CM | POA: Diagnosis not present

## 2020-12-07 DIAGNOSIS — M3505 Sjogren syndrome with inflammatory arthritis: Secondary | ICD-10-CM | POA: Diagnosis not present

## 2020-12-08 ENCOUNTER — Telehealth: Payer: Self-pay | Admitting: *Deleted

## 2020-12-08 NOTE — Telephone Encounter (Signed)
Patient contacted the office and left a message stating she saw her PCP yesterday and he advised to to call our office. Patient states she is having a burning pain on her left side. Patient states she this is day 13 of non stop pain. Patient states there is no skin irritation.

## 2020-12-09 NOTE — Telephone Encounter (Signed)
I spoke with Joyce [name] she describes burning type pain in left lateral thigh down to top of knee. Pain is 3/10 severity but constantly present. She is taking some ibuprofen but more for headache than the leg. I am not sure about this sounds suspicious for a new neuropathy change. She also reports starting to notice hair thinning. We will probably need to follow up sooner, but she has upcoming appointment on Monday with Dr. Sherryll Burger will also wait and see if any concerning findings for discussing possible IMT.

## 2020-12-12 DIAGNOSIS — H30033 Focal chorioretinal inflammation, peripheral, bilateral: Secondary | ICD-10-CM | POA: Diagnosis not present

## 2020-12-12 DIAGNOSIS — H04129 Dry eye syndrome of unspecified lacrimal gland: Secondary | ICD-10-CM | POA: Diagnosis not present

## 2021-01-02 NOTE — Progress Notes (Signed)
Office Visit Note  Patient: Joyce [name]             Date of Birth: [birth year]           MRN: [id number]             PCP: Emeterio Reeve, DO Referring: Emeterio Reeve, DO Visit Date: 01/03/2021   Subjective:  Follow-up (Doing good)   History of Present Illness: Joyce [name] is a 40 y.o. female here for follow up for sjogren's syndrome. She had interval visits with Dr. Manuella Ghazi findings consistent with dry eyes but no evidence of neuritis or papilledema. She experienced episode of left lateral thigh pain lasting at least 2 weeks with burning sensation and worse with walking and sensation such as pants rubbing against skin. She also experienced an episode of pain on the back of the neck that then spontaneously resolved. She has noticed some increased hair coming out with washing and brushing although no focal thin or bald spots noticed. She has no visible skin changes on any of the affected areas. She continues to notice salivary gland swelling intermittently. She also reports persistent symptom of diarrhea that is ongoing for about 8 months having around 3 loose bowel movements daily. She is taking imodium daily for these symptoms. She denies any blood or mucus in stools. She has a family history of colonic polyps but no malignancy or inflammatory bowel disease.  Previous HPI 10/13/20 Joyce [name] is a 40 y.o. female here for follow up for sjogren's syndrome with xerostomia and with bilateral optic neuritis. Since our last visit she experienced one day of being unable to see due to severely blurry vision and could not go to work. This improved spontaneously after one day. She has some persistent eye redness but denies feeling dry or painful. Her mouth remains very dry with alterations in taste such as pain with salty and sour foods. No recurrence of swelling of facial glands.   Previous HPI Joyce [name] is a 40 y.o. female here for evaluation of positive autoimmune serology checked in  association with optic neuritis and mild asymmetric lacrimal gland size.  Symptoms started around July with right eye pain particularly with movement after symptoms persisted for 10 days he went for evaluation.  We will send to her work-up including eye exam demonstrating swelling around the optic nerve and MRI that was largely unremarkable possible small lacrimal gland enlargement.  She did not start any particular treatments or intervention for this symptoms did improve.  She started having symptoms on the left side last week and went for eye exam on Friday felt the findings were consistent with those on the right but at earlier stage.  Besides this she did have an episode of right cheek and jaw pain a week ago improved spontaneously without treatment.  She has noticed new headaches typically start posteriorly and are exacerbated with forward bending leading to a bilateral frontal pounding type of pain.  She denies any sensation of dry eyes or itching does sometimes feel a dry mouth.   Labs reviewed 08/2020 ANA pos SSA >8.0 SSB >8.0 dsDNA, RNP, SM neg ESR 23 CRP <1 ACE 41 TSH wnl NMO IgG neg Vit D 37.7   Imaging reviewed 09/03/20 MRI Orbits IMPRESSION: This MRI of the orbits with and without contrast shows the following: 1.   No acute findings.  Normal enhancement. 2.   Optic nerves appear normal.   3.   Mild asymmetry of the lacrimal glands, larger on the  right.  This could be incidental as the dimensions of the glands are within normal limits.  They have normal signal.   Review of Systems  Constitutional:  Negative for fatigue.  HENT:  Positive for mouth dryness.   Eyes:  Negative for dryness.  Respiratory:  Negative for shortness of breath.   Cardiovascular:  Negative for swelling in legs/feet.  Gastrointestinal:  Positive for diarrhea.  Endocrine: Negative for excessive thirst.  Genitourinary:  Negative for difficulty urinating.  Musculoskeletal:  Negative for joint pain and joint  pain.  Skin:  Negative for rash.  Allergic/Immunologic: Negative for susceptible to infections.  Neurological:  Negative for numbness.  Hematological:  Negative for bruising/bleeding tendency.  Psychiatric/Behavioral:  Negative for sleep disturbance.    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   Activities involving scuba diving 06/14/2017    Past Medical History:  Diagnosis Date   Optic neuritis    Sjogren's syndrome ([place])     Family History  Problem Relation Age of Onset   Diabetes Mother    Colon polyps Mother    Hyperlipidemia Father    High blood pressure Father    Breast cancer Paternal Grandmother        Post menopause   Heart attack Paternal Grandfather    Past Surgical History:  Procedure Laterality Date   DILATION AND CURETTAGE OF UTERUS  2011   Social History   Social History Narrative   Not on file   Immunization History  Administered Date(s) Administered   Influenza,inj,Quad PF,6+ Mos 09/15/2018, 09/15/2018   Tdap 11/05/2016     Objective: Vital Signs: BP 118/78 (BP Location: Left Arm, Patient Position: Sitting, Cuff Size: Normal)   Pulse 80   Resp 15   Ht 5' 6" (1.676 m)   Wt 158 lb (71.7 kg)   BMI 25.50 kg/m    Physical Exam HENT:     Right Ear: External ear normal.     Left Ear: External ear normal.     Mouth/Throat:     Mouth: Mucous membranes are moist.     Pharynx: Oropharynx is clear.  Eyes:     Conjunctiva/sclera: Conjunctivae normal.  Cardiovascular:     Rate and Rhythm: Normal rate and regular rhythm.  Pulmonary:     Effort: Pulmonary effort is normal.     Breath sounds: Normal breath sounds.  Musculoskeletal:     Right lower leg: No edema.     Left lower leg: No edema.  Skin:     General: Skin is warm and dry.     Findings: No rash.  Neurological:     Mental Status: She is alert.  Psychiatric:        Mood and Affect: Mood normal.     Musculoskeletal Exam:  Neck full ROM no tenderness Elbows full ROM no tenderness or swelling Wrists full ROM no tenderness or swelling Fingers full ROM no tenderness or swelling Knees full ROM no tenderness or swelling   Investigation: No additional findings.  Imaging: No results found.  Recent Labs: Lab Results  Component Value Date   WBC 4.8 10/12/2020   HGB 12.1 10/12/2020   PLT 254 10/12/2020   NA 136 11/17/2018   K 3.9 11/17/2018   CL  104 11/17/2018   CO2 24 11/17/2018   GLUCOSE 83 11/17/2018   BUN 11 11/17/2018   CREATININE 0.87 11/17/2018   BILITOT 0.5 11/17/2018   AST 16 11/17/2018   ALT 15 11/17/2018   PROT 8.1 11/17/2018   ALBUMIN 4.4 07/06/2016   CALCIUM 9.3 11/17/2018   GFRAA 98 11/17/2018    Speciality Comments: No specialty comments available.  Procedures:  No procedures performed Allergies: Patient has no known allergies.   Assessment / Plan:     Visit Diagnoses: Sjogren syndrome, unspecified ([place]) - Plan: hydroxychloroquine (PLAQUENIL) 200 MG tablet  Overall doing well today but interval symptoms suspicious for disease activity including dry eyes, dry mouth, sialadenitis, and possibly disease related neuropathic pain, diarrhea, and alopecia. Serology was all very highly positive. I do not believe current symptoms warrant risk of strong immunosuppressing medication. Not having active swelling that I can determine so not recommend glucocorticoid treatment currently. She is continuing symptomatic treatment for dry eyes with drops and warm compress as needed. I recommend a trial of oral hydroxychloroquine 200 mg daily due to lesser side effect profile and may be beneficial especially cutaneous symptoms. Discussed side effect risks including arrhythmia, long term retinal toxicity, or sensitivity  or intolerance reactions.   Neuropathic pain of thigh, left  Symptom distribution appears to be consistent with lateral femoral cutaneous nerve she has no changes to explain sudden entrapment, suspicious for small fiber neuropathy component with the burning pain symptoms. If symptoms recur more frequently can discuss trying topical medication versus systemic medications such as gabapentinoids or TCAs.  Orders: No orders of the defined types were placed in this encounter.  Meds ordered this encounter  Medications   hydroxychloroquine (PLAQUENIL) 200 MG tablet    Sig: Take 1 tablet (200 mg total) by mouth daily.    Dispense:  30 tablet    Refill:  2      Follow-Up Instructions: Return in about 3 months (around 04/04/2021) for pSS HCQ start f/u 15mo.   CCollier Salina MD  Note - This record has been created using [employer]  Chart creation errors have been sought, but may not always  have been located. Such creation errors do not reflect on  the standard of medical care.

## 2021-01-03 ENCOUNTER — Other Ambulatory Visit: Payer: Self-pay

## 2021-01-03 ENCOUNTER — Ambulatory Visit (INDEPENDENT_AMBULATORY_CARE_PROVIDER_SITE_OTHER): Payer: BC Managed Care – PPO | Admitting: Internal Medicine

## 2021-01-03 ENCOUNTER — Encounter: Payer: Self-pay | Admitting: Internal Medicine

## 2021-01-03 VITALS — BP 118/78 | HR 80 | Resp 15 | Ht 66.0 in | Wt 158.0 lb

## 2021-01-03 DIAGNOSIS — M3500 Sicca syndrome, unspecified: Secondary | ICD-10-CM | POA: Diagnosis not present

## 2021-01-03 DIAGNOSIS — M792 Neuralgia and neuritis, unspecified: Secondary | ICD-10-CM

## 2021-01-03 MED ORDER — HYDROXYCHLOROQUINE SULFATE 200 MG PO TABS
200.0000 mg | ORAL_TABLET | Freq: Every day | ORAL | 2 refills | Status: DC
Start: 2021-01-03 — End: 2021-03-13

## 2021-01-03 NOTE — Patient Instructions (Signed)
Hydroxychloroquine Tablets °What is this medication? °HYDROXYCHLOROQUINE (hye drox ee KLOR oh kwin) treats autoimmune conditions, such as rheumatoid arthritis and lupus. It works by slowing down an overactive immune system. It may also be used to prevent and treat malaria. It works by killing the parasite that causes malaria. It belongs to a group of medications called DMARDs. °This medicine may be used for other purposes; ask your health care provider or pharmacist if you have questions. °COMMON BRAND NAME(S): Plaquenil, Quineprox °What should I tell my care team before I take this medication? °They need to know if you have any of these conditions: °Diabetes °Eye disease, vision problems °G6PD deficiency °Heart disease °History of irregular heartbeat °If you often drink alcohol °Kidney disease °Liver disease °Porphyria °Psoriasis °An unusual or allergic reaction to chloroquine, hydroxychloroquine, other medications, foods, dyes, or preservatives °Pregnant or trying to get pregnant °Breast-feeding °How should I use this medication? °Take this medication by mouth with a glass of water. Take it as directed on the prescription label. Do not cut, crush or chew this medication. Swallow the tablets whole. Take it with food. Do not take it more than directed. Take all of this medication unless your care team tells you to stop it early. Keep taking it even if you think you are better. °Take products with antacids in them at a different time of day than this medication. Take this medication 4 hours before or 4 hours after antacids. Talk to your care team if you have questions. °Talk to your care team about the use of this medication in children. While this medication may be prescribed for selected conditions, precautions do apply. °Overdosage: If you think you have taken too much of this medicine contact a poison control center or emergency room at once. °NOTE: This medicine is only for you. Do not share this medicine with  others. °What if I miss a dose? °If you miss a dose, take it as soon as you can. If it is almost time for your next dose, take only that dose. Do not take double or extra doses. °What may interact with this medication? °Do not take this medication with any of the following: °Cisapride °Dronedarone °Pimozide °Thioridazine °This medication may also interact with the following: °Ampicillin °Antacids °Cimetidine °Cyclosporine °Digoxin °Kaolin °Medications for diabetes, like insulin, glipizide, glyburide °Medications for seizures like carbamazepine, phenobarbital, phenytoin °Mefloquine °Methotrexate °Other medications that prolong the QT interval (cause an abnormal heart rhythm) °Praziquantel °This list may not describe all possible interactions. Give your health care provider a list of all the medicines, herbs, non-prescription drugs, or dietary supplements you use. Also tell them if you smoke, drink alcohol, or use illegal drugs. Some items may interact with your medicine. °What should I watch for while using this medication? °Visit your care team for regular checks on your progress. Tell your care team if your symptoms do not start to get better or if they get worse. °You may need blood work done while you are taking this medication. If you take other medications that can affect heart rhythm, you may need more testing. Talk to your care team if you have questions. °Your vision may be tested before and during use of this medication. Tell your care team right away if you have any change in your eyesight. °This medication may cause serious skin reactions. They can happen weeks to months after starting the medication. Contact your care team right away if you notice fevers or flu-like symptoms with a rash. The   rash may be red or purple and then turn into blisters or peeling of the skin. Or, you might notice a red rash with swelling of the face, lips or lymph nodes in your neck or under your arms. °If you or your family  notice any changes in your behavior, such as new or worsening depression, thoughts of harming yourself, anxiety, or other unusual or disturbing thoughts, or memory loss, call your care team right away. °What side effects may I notice from receiving this medication? °Side effects that you should report to your care team as soon as possible: °Allergic reactions--skin rash, itching, hives, swelling of the face, lips, tongue, or throat °Aplastic anemia--unusual weakness or fatigue, dizziness, headache, trouble breathing, increased bleeding or bruising °Change in vision °Heart rhythm changes--fast or irregular heartbeat, dizziness, feeling faint or lightheaded, chest pain, trouble breathing °Infection--fever, chills, cough, or sore throat °Low blood sugar (hypoglycemia)--tremors or shaking, anxiety, sweating, cold or clammy skin, confusion, dizziness, rapid heartbeat °Muscle injury--unusual weakness or fatigue, muscle pain, dark yellow or brown urine, decrease in amount of urine °Pain, tingling, or numbness in the hands or feet °Rash, fever, and swollen lymph nodes °Redness, blistering, peeling, or loosening of the skin, including inside the mouth °Thoughts of suicide or self-harm, worsening mood, or feelings of depression °Unusual bruising or bleeding °Side effects that usually do not require medical attention (report to your care team if they continue or are bothersome): °Diarrhea °Headache °Nausea °Stomach pain °Vomiting °This list may not describe all possible side effects. Call your doctor for medical advice about side effects. You may report side effects to FDA at 1-800-FDA-1088. °Where should I keep my medication? °Keep out of the reach of children and pets. °Store at room temperature up to 30 degrees C (86 degrees F). Protect from light. Get rid of any unused medication after the expiration date. °To get rid of medications that are no longer needed or have expired: °Take the medication to a medication take-back  program. Check with your pharmacy or law enforcement to find a location. °If you cannot return the medication, check the label or package insert to see if the medication should be thrown out in the garbage or flushed down the toilet. If you are not sure, ask your care team. If it is safe to put it in the trash, empty the medication out of the container. Mix the medication with cat litter, dirt, coffee grounds, or other unwanted substance. Seal the mixture in a bag or container. Put it in the trash. °NOTE: This sheet is a summary. It may not cover all possible information. If you have questions about this medicine, talk to your doctor, pharmacist, or health care provider. °© 2022 [employer] (2020-06-09 00:00:00) ° °

## 2021-01-24 ENCOUNTER — Telehealth: Payer: Self-pay

## 2021-01-24 NOTE — Telephone Encounter (Signed)
Patient called stating she began taking Plaquenil on 01/03/21 and has now developed an itchy rash on her chest and buttocks.  Patient states she took her last pill last night 01/23/21 and doesn't plan to take anymore.

## 2021-02-01 ENCOUNTER — Encounter: Payer: Self-pay | Admitting: Internal Medicine

## 2021-03-13 ENCOUNTER — Other Ambulatory Visit: Payer: Self-pay

## 2021-03-13 ENCOUNTER — Ambulatory Visit (INDEPENDENT_AMBULATORY_CARE_PROVIDER_SITE_OTHER): Payer: 59 | Admitting: Internal Medicine

## 2021-03-13 ENCOUNTER — Encounter: Payer: Self-pay | Admitting: Internal Medicine

## 2021-03-13 VITALS — BP 113/77 | HR 79 | Ht 66.0 in | Wt 158.4 lb

## 2021-03-13 DIAGNOSIS — H5711 Ocular pain, right eye: Secondary | ICD-10-CM | POA: Diagnosis not present

## 2021-03-13 DIAGNOSIS — M792 Neuralgia and neuritis, unspecified: Secondary | ICD-10-CM | POA: Diagnosis not present

## 2021-03-13 DIAGNOSIS — M3500 Sicca syndrome, unspecified: Secondary | ICD-10-CM

## 2021-03-13 NOTE — Progress Notes (Signed)
Office Visit Note  Patient: Joyce [name]             Date of Birth: 10/05/1980           MRN: [id number]             PCP: Sunnie Nielsen, DO Referring: Sunnie Nielsen, DO Visit Date: 03/13/2021   Subjective:   History of Present Illness: Joyce [name] is a 41 y.o. female here for follow up sjogren's syndrome she started hydroxychloroquine after last visit but developed new skin rashes so stopped this medication.  Skin rash started about 3 weeks after starting the medicine and fully resolved within about 3 weeks after stopping.  Her biggest problem continues to be symptoms with her eyes which are dry and sometimes painful.  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  The pain improves once no longer staring at screens but quickly returns when looking towards the screens.  She uses the artificial tears type drops and has not noticed any other change or problem with visual acuity.  She also has mouth dryness this is less of a concern she drinks a lot of liquid and uses a mouth spray occasionally when needed. She reports her diarrhea resolves once starting the hydroxychloroquine and this has stayed improved despite discontinuing the medicine.  She reports some weight gain attributes this to eating out. She experiences a burning type of pain on the left thigh lasting about 2 weeks followed by about 1 week on the right upper arm the symptom has not recurred.  She is having some thigh pain and stiffness in the mornings bilaterally.   Previous HPI 01/03/21 Joyce [name] is a 41 y.o. female here for follow up for sjogren's syndrome. She had interval visits with Dr. Sherryll Burger findings consistent with dry eyes but no evidence of neuritis or papilledema. She experienced episode of left lateral thigh pain lasting at least 2 weeks with burning sensation and worse with walking and sensation such as pants rubbing against  skin. She also experienced an episode of pain on the back of the neck that then spontaneously resolved. She has noticed some increased hair coming out with washing and brushing although no focal thin or bald spots noticed. She has no visible skin changes on any of the affected areas. She continues to notice salivary gland swelling intermittently. She also reports persistent symptom of diarrhea that is ongoing for about 8 months having around 3 loose bowel movements daily. She is taking imodium daily for these symptoms. She denies any blood or mucus in stools. She has a family history of colonic polyps but no malignancy or inflammatory bowel disease.   Previous HPI 10/12/20 Joyce [name] is a 41 y.o. female here for evaluation of positive autoimmune serology checked in association with optic neuritis and mild asymmetric lacrimal gland size.  Symptoms started around July with right eye pain particularly with movement after symptoms persisted for 10 days he went for evaluation.  We will send to her work-up including eye exam demonstrating swelling around the optic nerve and MRI that was largely unremarkable possible small lacrimal gland enlargement.  She did not start any particular treatments or intervention for this symptoms did improve.  She started having symptoms on the left side last week and went for eye exam on Friday felt the findings were consistent with those on the right but at earlier stage. Besides this she did  have an episode of right cheek and jaw pain a week ago improved spontaneously without treatment.  She has noticed new headaches typically start posteriorly and are exacerbated with forward bending leading to a bilateral frontal pounding type of pain.  She denies any sensation of dry eyes or itching does sometimes feel a dry mouth.   Labs reviewed 2022 ANA pos SSA >8.0 SSB >8.0 RF 66 IgG 3800 Complement C4 12  Review of Systems  Constitutional:  Negative for fatigue.  HENT:  Positive  for mouth dryness and nose dryness. Negative for mouth sores.   Eyes:  Positive for pain, itching and dryness.  Respiratory:  Negative for shortness of breath and difficulty breathing.   Cardiovascular:  Negative for chest pain and palpitations.  Gastrointestinal:  Negative for blood in stool, constipation and diarrhea.  Endocrine: Negative for increased urination.  Genitourinary:  Negative for difficulty urinating.  Musculoskeletal:  Positive for myalgias, muscle tenderness and myalgias. Negative for joint pain, joint pain, joint swelling and morning stiffness.  Skin:  Negative for color change, rash and redness.  Allergic/Immunologic: Negative for susceptible to infections.  Neurological:  Positive for numbness. Negative for dizziness, headaches, memory loss and weakness.  Hematological:  Positive for bruising/bleeding tendency.  Psychiatric/Behavioral:  Negative for confusion.    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   Activities involving scuba diving 06/14/2017    Past Medical History:  Diagnosis Date   Optic neuritis    Sjogren's syndrome (HCC)     Family History  Problem Relation Age of Onset   Diabetes Mother    Colon polyps Mother    Hyperlipidemia Father    High blood pressure Father    Breast cancer Paternal Grandmother        Post menopause   Heart attack Paternal Grandfather    Past Surgical History:  Procedure Laterality Date   DILATION AND CURETTAGE OF UTERUS  2011   Social History   Social History Narrative   Not on file   Immunization History  Administered Date(s) Administered   Influenza,inj,Quad PF,6+ Mos 09/15/2018, 09/15/2018   Tdap 11/05/2016      Objective: Vital Signs: BP 113/77 (BP Location: Left Arm, Patient Position: Sitting, Cuff Size: Normal)    Pulse 79    Ht 5\' 6"  (1.676 m)    Wt 158 lb 6.4 oz (71.8 kg)    BMI 25.57 kg/m    Physical Exam HENT:     Mouth/Throat:     Mouth: Mucous membranes are moist.     Pharynx: Oropharynx is clear.  Eyes:     Conjunctiva/sclera: Conjunctivae normal.     Comments: Pingueculae in both eyes. No conjunctival injection, normal eye movement and reaction to light. No periorbital swelling.  Cardiovascular:     Rate and Rhythm: Normal rate and regular rhythm.  Pulmonary:     Effort: Pulmonary effort is normal.     Breath sounds: Normal breath sounds.  Musculoskeletal:     Right lower leg: No edema.     Left lower leg: No edema.  Skin:    General: Skin is warm and dry.     Findings: No rash.  Neurological:     General:  No focal deficit present.     Mental Status: She is alert.  Psychiatric:        Mood and Affect: Mood normal.     Musculoskeletal Exam:  Shoulders full ROM no tenderness or swelling Elbows full ROM no tenderness or swelling Wrists full ROM no tenderness or swelling Fingers full ROM no tenderness or swelling Knees full ROM no tenderness or swelling Ankles full ROM no tenderness or swelling   Investigation: No additional findings.  Imaging: No results found.  Recent Labs: Lab Results  Component Value Date   WBC 4.8 10/12/2020   HGB 12.1 10/12/2020   PLT 254 10/12/2020   NA 136 11/17/2018   K 3.9 11/17/2018   CL 104 11/17/2018   CO2 24 11/17/2018   GLUCOSE 83 11/17/2018   BUN 11 11/17/2018   CREATININE 0.87 11/17/2018   BILITOT 0.5 11/17/2018   AST 16 11/17/2018   ALT 15 11/17/2018   PROT 8.1 11/17/2018   ALBUMIN 4.4 07/06/2016   CALCIUM 9.3 11/17/2018   GFRAA 98 11/17/2018    Speciality Comments: No specialty comments available.  Procedures:  No procedures performed Allergies: Plaquenil [hydroxychloroquine]   Assessment / Plan:      Visit Diagnoses: Sjogren syndrome, unspecified (HCC)  She continues to have multiple symptoms including dry eyes and mouth, with some intermittent myalgias and areas of what sounds like a neuropathic pain. I don't see any significant changes on exam today.  I discussed treatment options but she did not tolerate hydroxychloroquine and I think risk outweighs benefit for more aggressive immunosuppressive medication at this time. May plan or rechecking serology for disease activity later this year see if there is any major change.  Neuropathic pain of thigh, left  I recommended if symptoms return like before would consider short prednisone taper treatment to improve this and also see how responsive to steroids.  Pain around right eye  I will check with Dr. Sherryll Burger if he has further recommendations on localized treatment for her eye pain and sensitivity.  Orders: No orders of the defined types were placed in this encounter.  No orders of the defined types were placed in this encounter.    Follow-Up Instructions: Return in about 6 months (around 09/10/2021) for pSS obs f/u 29mos.   Fuller Plan, MD  Note - This record has been created using [employer].  Chart creation errors have been sought, but may not always  have been located. Such creation errors do not reflect on  the standard of medical care.

## 2021-03-28 ENCOUNTER — Encounter: Payer: Self-pay | Admitting: Neurology

## 2021-03-28 ENCOUNTER — Ambulatory Visit (INDEPENDENT_AMBULATORY_CARE_PROVIDER_SITE_OTHER): Payer: 59 | Admitting: Neurology

## 2021-03-28 VITALS — BP 117/70 | HR 72 | Ht 66.0 in | Wt 158.0 lb

## 2021-03-28 DIAGNOSIS — M3500 Sicca syndrome, unspecified: Secondary | ICD-10-CM

## 2021-03-28 DIAGNOSIS — M792 Neuralgia and neuritis, unspecified: Secondary | ICD-10-CM

## 2021-03-28 DIAGNOSIS — H469 Unspecified optic neuritis: Secondary | ICD-10-CM

## 2021-03-28 DIAGNOSIS — G378 Other specified demyelinating diseases of central nervous system: Secondary | ICD-10-CM

## 2021-03-28 DIAGNOSIS — G360 Neuromyelitis optica [Devic]: Secondary | ICD-10-CM

## 2021-03-28 NOTE — Progress Notes (Signed)
[facility]  PATIENT: Joyce [name] DOB: March 05, 1980  REFERRING DOCTOR OR PCP: Rona Ravens, OD; Emeterio Reeve (PCP) SOURCE: Patient, notes from optometry.  _________________________________   HISTORICAL  CHIEF COMPLAINT:  Chief Complaint  Patient presents with   Follow-up    Rm 1, alone. Here for 7 month f/u for optic neuritis. Pt has more so eye pn then optic nerve pn.     HISTORY OF PRESENT ILLNESS:  Joyce [name] is a 41 y.o. woman with eye pain and possible optic neuritis.  UPDATE 03/28/2021: Since the last visit, she had MRI brain and orbits.   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   Orbit MRI showed normal orbits though the lacrimal glands were asymmetric thoguh not necessarily enlarged.   Signal was normal.     She has eye pain and blurry vision.   She notes more issues staring at a computer.     She denies any history of numbness, clumsiness, weakness or visual change consistent with demyelination.  She has no neuro-imaging studies.      She had positive ANA and ENA showed SSA and SSB antibodies.  She saw Dr, Benjamine Mola (Rheum).  RA also elevated.   She saw rheumatology (Dr. Benjamine Mola) and diagnosed with Sjogren's.  Plaquenil was started but she had a rash and it was discontinued.   Due to risk/benefit, another drug was felt to likely hel the eye.    Repeat visit to Dr. Dwana Melena [facility]) showed no disc edema or optic neuritis,   He diagnosed focal chorioretinal inflammation.  OCT was normal.    Visual History: On 08/04/2020, she had the onset of eye pain .  Pain peaked about a week later and then began to improve.   Movement of the eye worsened the pain.    By 08/15/2020, pain was only occurring on far lateral gaze.  That day, she saw Dr. Pamelia Hoit.  The dilated eye exam showed a slightly elevated/swollen right optic nerve.  This was confirmed with OCT testing.  At no time did she notice any change in visual acuity, visual field or  color vision..      She is otherwise healthy and takes no medications except for multivitamins.    She has no personal history of an autoimmune disorder.  No family history of MS or other autoimmune disorders.  IMAGING: MRI brain 09/03/2020 showed  This is a normal MRI of the brain with and without contrast.  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   MRI orbits 09/03/2020 showed: 1.   No acute findings.  Normal enhancement. 2.   Optic nerves appear normal.   3.   Mild asymmetry of the lacrimal glands, larger on the right.  This could be incidental as the dimensions of the glands are within normal limits.  They have normal signal.  LABS: 08/13/2020:  ANA positive with ENA showing increased SSA and SSB 09/11/2021:  decreased C4 12 (15-57), normal C3, elevated RF=66, increased IgG=3800 (normal IgM and IgA) , elevated IgG4 97.9 (4-86) 11/15/2020: SPEP with increased gamma globulin but no M spike; RA titer = 160 (<10 normal); HIV negative; RPR negative; Lyme negative, Bartonella negative, anti-CCP negative; HLA,   NMO IgG negative, TSH nornmal, Vit D normal, ESR/CRP normal  REVIEW OF SYSTEMS: Constitutional: No fevers, chills, sweats, or change in appetite Eyes: No visual changes, double vision, eye pain Ear, nose and  No hearing loss, ear pain, nasal congestion, sore throat °Cardiovascular: No chest pain, palpitations °Respiratory:  No shortness of breath at rest or with exertion.   No wheezes °GastrointestinaI: No nausea, vomiting, diarrhea, abdominal pain, fecal incontinence °Genitourinary:  No dysuria, urinary retention or frequency.  No nocturia. °Musculoskeletal:  No neck pain, back pain °Integumentary: No rash, pruritus, skin lesions °Neurological: as above °Psychiatric: No depression at this time.  No anxiety °Endocrine: No palpitations, diaphoresis, change in appetite, change in weigh or increased  thirst °Hematologic/Lymphatic:  No anemia, purpura, petechiae. °Allergic/Immunologic: No itchy/runny eyes, nasal congestion, recent allergic reactions, rashes ° °ALLERGIES: °Allergies  °Allergen Reactions  ° Plaquenil [Hydroxychloroquine]   °  rash  ° ° °HOME MEDICATIONS: ° °Current Outpatient Medications:  °  Multiple Vitamin (MULTIVITAMIN) tablet, Take 1 tablet by mouth daily., Disp: , Rfl:  °  PARAGARD INTRAUTERINE COPPER IU, by Intrauterine route., Disp: , Rfl:  ° °PAST MEDICAL HISTORY: °Past Medical History:  °Diagnosis Date  ° Optic neuritis   ° Sjogren's syndrome (HCC)   ° ° °PAST SURGICAL HISTORY: °Past Surgical History:  °Procedure Laterality Date  ° DILATION AND CURETTAGE OF UTERUS  2011  ° ° °FAMILY HISTORY: °Family History  °Problem Relation Age of Onset  ° Diabetes Mother   ° Colon polyps Mother   ° Hyperlipidemia Father   ° High blood pressure Father   ° Breast cancer Paternal Grandmother   °     Post menopause  ° Heart attack Paternal Grandfather   ° ° °SOCIAL HISTORY: ° °Social History  ° °Socioeconomic History  ° Marital status: Married  °  Spouse name: Not on file  ° Number of children: 2  ° Years of education: Not on file  ° Highest education level: Not on file  °Occupational History  ° Not on file  °Tobacco Use  ° Smoking status: Never  ° Smokeless tobacco: Never  °Vaping Use  ° Vaping Use: Never used  °Substance and Sexual Activity  ° Alcohol use: Yes  °  Comment: occasionally   ° Drug use: Never  ° Sexual activity: Yes  °  Birth control/protection: Condom, I.U.D.  °  Comment: Paragard IUD inserted 11/27/18  °Other Topics Concern  ° Not on file  °Social History Narrative  ° Not on file  ° °Social Determinants of Health  ° °Financial Resource Strain: Not on file  °Food Insecurity: Not on file  °Transportation Needs: Not on file  °Physical Activity: Not on file  °Stress: Not on file  °Social Connections: Not on file  °Intimate Partner Violence: Not on file  ° ° ° °PHYSICAL EXAM ° °Vitals:  °  03/28/21 0756  °BP: 117/70  °Pulse: 72  °Weight: 158 lb (71.7 kg)  °Height: 5' 6" (1.676 m)  ° ° °Body mass index is 25.5 kg/m². ° °Visual acuity: °OD 20/25 °OS 20/20 °Symmetric color vision ° °General: The patient is well-developed and well-nourished and in no acute distress ° °HEENT:  Head is [place]/AT.  Sclera are anicteric.  Funduscopic exam showed a normal optic disc on the left and very minimal blurring of the nasal disc margin.  Venous pulsations were present.. ° °Neck: No carotid bruits are noted.  The neck is nontender. ° °Cardiovascular: The heart has a regular rate and rhythm with a normal S1 and S2. There were no murmurs, gallops or rubs.   ° °Skin: Extremities are without rash or  edema. ° °Musculoskeletal:  Back is nontender ° °Neurologic Exam ° °Mental   Mental status: The patient is alert and oriented x 3 at the time of the examination. The patient has apparent normal recent and remote memory, with an apparently normal attention span and concentration ability.   Speech is normal.  Cranial nerves: Extraocular movements are full.  She had a mild afferent pupillary defect on the right.  Color vision was symmetric..  Facial symmetry is present. There is good facial sensation to soft touch bilaterally.Facial strength is normal.  Trapezius and sternocleidomastoid strength is normal. No dysarthria is noted.  No obvious hearing deficits are noted.  Motor:  Muscle bulk is normal.   Tone is normal. Strength is  5 / 5 in all 4 extremities.   Sensory: Sensory testing is intact to pinprick, soft touch and vibration sensation in all 4 extremities.  Coordination: Cerebellar testing reveals good finger-nose-finger and heel-to-shin bilaterally.  Gait and station: Station is normal.   Gait is normal. Tandem gait is normal. Romberg is negative.   Reflexes: Deep tendon reflexes are symmetric and normal bilaterally.      DIAGNOSTIC DATA (LABS, IMAGING, TESTING) - I reviewed patient records, labs, notes, testing and  imaging myself where available.  Lab Results  Component Value Date   WBC 4.8 10/12/2020   HGB 12.1 10/12/2020   HCT 36.9 10/12/2020   MCV 85.8 10/12/2020   PLT 254 10/12/2020      Component Value Date/Time   NA 136 11/17/2018 1155   K 3.9 11/17/2018 1155   CL 104 11/17/2018 1155   CO2 24 11/17/2018 1155   GLUCOSE 83 11/17/2018 1155   BUN 11 11/17/2018 1155   CREATININE 0.87 11/17/2018 1155   CALCIUM 9.3 11/17/2018 1155   PROT 8.1 11/17/2018 1155   ALBUMIN 4.4 07/06/2016 0000   AST 16 11/17/2018 1155   ALT 15 11/17/2018 1155   BILITOT 0.5 11/17/2018 1155   GFRNONAA 85 11/17/2018 1155   GFRAA 98 11/17/2018 1155   Lab Results  Component Value Date   CHOL 149 11/17/2018   HDL 59 11/17/2018   LDLCALC 74 11/17/2018   TRIG 76 11/17/2018   CHOLHDL 2.5 11/17/2018   Lab Results  Component Value Date   HGBA1C 4.9 07/06/2016   No results found for: VITAMINB12 Lab Results  Component Value Date   TSH 1.820 08/23/2020        ASSESSMENT AND PLAN  Left optic neuritis - Plan: MR BRAIN W WO CONTRAST, Anti-MOG, Serum  Right optic neuritis - Plan: MR BRAIN W WO CONTRAST, Anti-MOG, Serum  Neuropathic pain of thigh, left  Sjogren syndrome, unspecified ([place]) - Plan: MR BRAIN W WO CONTRAST  She continues to have fluctuating visual symptoms.  The optic neuritis was likely due to Sjogren's disease.  However, due to her age we still need to check an additional MRI of the brain (we will do in a couple months) to determine if there are new lesions that develop over time.  If this is occurring I would be more concerned about multiple sclerosis.  If the MRI shows nothing new, then I think the likelihood is extremely low. Although the optic neuritis is likely due to Sjogren's.  We will check an anti-MOG just to make sure that she does not have MOGAD variant of neuromyelitis optica 3.   She will return to see [place] as needed and call if she has new or worsening neurologic  symptoms.   Thena Devora A. Felecia Shelling, MD, [facility] 3/61/4431, 5:40 PM Certified in Neurology, Clinical Neurophysiology, Sleep Medicine and Neuroimaging  [facility] [address] [place], [place] [address] [phone]

## 2021-03-30 ENCOUNTER — Encounter: Payer: Self-pay | Admitting: Neurology

## 2021-03-30 LAB — ANTI-MOG, SERUM: MOG Antibody, Cell-based IFA: POSITIVE — AB

## 2021-03-30 LAB — ANTI-MOG ANTIBODY TITER, SERUM: Anti-MOG Antibody Titer, Serum: 1:320 {titer}

## 2021-04-03 ENCOUNTER — Ambulatory Visit: Payer: BC Managed Care – PPO | Admitting: Internal Medicine

## 2021-04-03 ENCOUNTER — Telehealth: Payer: Self-pay | Admitting: Neurology

## 2021-04-03 DIAGNOSIS — Z79899 Other long term (current) drug therapy: Secondary | ICD-10-CM

## 2021-04-03 DIAGNOSIS — G378 Other specified demyelinating diseases of central nervous system: Secondary | ICD-10-CM

## 2021-04-03 NOTE — Telephone Encounter (Signed)
Dr. Dimple Casey, Ms. Joyce [name] is a mutual patient.  I saw her last summer for optic neuritis.  MRI showed asymmetric lacrimal glands and she reported dry mouth and dry eyes.  SSA and SSB were both positive and she was referred to you.  She has not had any recurrence of the optic neuritis or other neurologic symptoms.   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  She reports continued dry mouth and dry eyes.  Dry eyes were also seen at her latest ophthalmology appointment.  MOGAD is rare and has no FDA approved therapy.  More than half of the patient's will have a relapsing course.  CellCept, Imuran, steroids, Rituxan, Actemra, IVIG and plasmapheresis have all been shown to have some benefit in reducing exacerbations.    To prevent relapses and to treat her concurrent Sjogren's syndrome, I would like to start her on Rituxan.  We can do this in the office.  However, before I do so I had wanted to run this option by you.  If you think Actemra might be better, we could do that instead.  Thank you [name] [phone] (cell)     The anti-MOG result and treatment options were discussed with [name].  I will check some lab work to consider Rituxan or other agent

## 2021-04-04 ENCOUNTER — Other Ambulatory Visit (INDEPENDENT_AMBULATORY_CARE_PROVIDER_SITE_OTHER): Payer: Self-pay

## 2021-04-04 DIAGNOSIS — G378 Other specified demyelinating diseases of central nervous system: Secondary | ICD-10-CM

## 2021-04-04 DIAGNOSIS — Z0289 Encounter for other administrative examinations: Secondary | ICD-10-CM

## 2021-04-04 DIAGNOSIS — Z79899 Other long term (current) drug therapy: Secondary | ICD-10-CM

## 2021-04-04 NOTE — Telephone Encounter (Signed)
Thank you for the update. I do not have much experience with this particular condition. I definitely appreciate the help in getting to the root of this. For her sjogren's syndrome rituximab would probably be the most effective. I actually discussed it with her somewhat at a previous visit but glandular involvement alone did not appear bad enough to justify potential side effects. I don't have any opposition to Actemra but the limited data available has not shown any benefit for sjogren's. Let me know if I can help out or if there are other specific questions.  Thanks, Orest Dikes [phone] (cell)

## 2021-04-05 DIAGNOSIS — G378 Other specified demyelinating diseases of central nervous system: Secondary | ICD-10-CM | POA: Insufficient documentation

## 2021-04-07 LAB — CBC WITH DIFFERENTIAL/PLATELET
Basophils Absolute: 0 10*3/uL (ref 0.0–0.2)
Basos: 1 %
EOS (ABSOLUTE): 0.1 10*3/uL (ref 0.0–0.4)
Eos: 2 %
Hematocrit: 34.5 % (ref 34.0–46.6)
Hemoglobin: 11.7 g/dL (ref 11.1–15.9)
Immature Grans (Abs): 0 10*3/uL (ref 0.0–0.1)
Immature Granulocytes: 0 %
Lymphocytes Absolute: 0.9 10*3/uL (ref 0.7–3.1)
Lymphs: 26 %
MCH: 28.3 pg (ref 26.6–33.0)
MCHC: 33.9 g/dL (ref 31.5–35.7)
MCV: 84 fL (ref 79–97)
Monocytes Absolute: 0.5 10*3/uL (ref 0.1–0.9)
Monocytes: 13 %
Neutrophils Absolute: 2.1 10*3/uL (ref 1.4–7.0)
Neutrophils: 58 %
Platelets: 203 10*3/uL (ref 150–450)
RBC: 4.13 x10E6/uL (ref 3.77–5.28)
RDW: 12.2 % (ref 11.7–15.4)
WBC: 3.6 10*3/uL (ref 3.4–10.8)

## 2021-04-07 LAB — HEPATIC FUNCTION PANEL
ALT: 17 IU/L (ref 0–32)
AST: 21 IU/L (ref 0–40)
Albumin: 4.3 g/dL (ref 3.8–4.8)
Alkaline Phosphatase: 58 IU/L (ref 44–121)
Bilirubin Total: 0.5 mg/dL (ref 0.0–1.2)
Bilirubin, Direct: 0.11 mg/dL (ref 0.00–0.40)
Total Protein: 8.4 g/dL (ref 6.0–8.5)

## 2021-04-07 LAB — QUANTIFERON-TB GOLD PLUS
QuantiFERON Mitogen Value: >10 IU/mL
QuantiFERON Nil Value: 0 IU/mL
QuantiFERON TB1 Ag Value: 0 IU/mL
QuantiFERON TB2 Ag Value: 0 IU/mL
QuantiFERON-TB Gold Plus: NEGATIVE

## 2021-04-07 LAB — IGG, IGA, IGM
IgA/Immunoglobulin A, Serum: 154 mg/dL (ref 87–352)
IgG (Immunoglobin G), Serum: 3187 mg/dL — ABNORMAL HIGH (ref 586–1602)
IgM (Immunoglobulin M), Srm: 180 mg/dL (ref 26–217)

## 2021-04-07 LAB — HEPATITIS B SURFACE ANTIBODY,QUALITATIVE: Hep B Surface Ab, Qual: REACTIVE

## 2021-04-07 LAB — HEPATITIS B SURFACE ANTIGEN: Hepatitis B Surface Ag: NEGATIVE

## 2021-04-07 LAB — VARICELLA ZOSTER ANTIBODY, IGG: Varicella zoster IgG: 446 index (ref >165)

## 2021-04-07 LAB — HEPATITIS C ANTIBODY: Hep C Virus Ab: NONREACTIVE

## 2021-04-07 LAB — HEPATITIS B CORE ANTIBODY, TOTAL: Hep B Core Total Ab: NEGATIVE

## 2021-04-10 ENCOUNTER — Encounter: Payer: Self-pay | Admitting: Neurology

## 2021-04-10 ENCOUNTER — Encounter: Payer: Self-pay | Admitting: Internal Medicine

## 2021-04-10 NOTE — Telephone Encounter (Signed)
The following order was given to infusion suite today:  ? ?

## 2021-04-26 ENCOUNTER — Ambulatory Visit: Payer: BC Managed Care – PPO | Admitting: Internal Medicine

## 2021-06-27 DIAGNOSIS — Z0189 Encounter for other specified special examinations: Secondary | ICD-10-CM | POA: Insufficient documentation

## 2021-06-27 DIAGNOSIS — Y9315 Activity, underwater diving and snorkeling: Secondary | ICD-10-CM | POA: Insufficient documentation

## 2021-06-27 NOTE — Patient Instructions (Signed)
Go to the [facility] to get your labs done: [address] $27     Go to [facility] and get your Chest x-ray done: [address] [place] inside the [facility] on the 1st Floor [phone]

## 2021-06-28 ENCOUNTER — Other Ambulatory Visit (INDEPENDENT_AMBULATORY_CARE_PROVIDER_SITE_OTHER): Payer: 59

## 2021-06-28 ENCOUNTER — Encounter: Payer: Self-pay | Admitting: Family Medicine

## 2021-06-28 ENCOUNTER — Ambulatory Visit: Payer: 59 | Admitting: Family Medicine

## 2021-06-28 ENCOUNTER — Ambulatory Visit
Admission: RE | Admit: 2021-06-28 | Discharge: 2021-06-28 | Disposition: A | Discharge status: Home / Self Care | Payer: No Typology Code available for payment source | Source: Ambulatory Visit | Attending: Family Medicine | Admitting: Family Medicine

## 2021-06-28 DIAGNOSIS — Z0189 Encounter for other specified special examinations: Secondary | ICD-10-CM

## 2021-06-28 DIAGNOSIS — Y9315 Activity, underwater diving and snorkeling: Secondary | ICD-10-CM

## 2021-06-28 LAB — GLUCOSE, POCT (MANUAL RESULT ENTRY): POC Glucose: 94 mg/dl (ref 70–99)

## 2021-06-28 LAB — POCT UA - GLUCOSE/PROTEIN
Glucose, UA: NEGATIVE
Protein, UA: NEGATIVE

## 2021-06-28 LAB — POCT HEMOGLOBIN: Hemoglobin: 11.5 g/dL (ref 11–14.6)

## 2021-06-28 NOTE — Progress Notes (Signed)
PCP: Sunnie Nielsen, DO  Subjective:   HPI: Patient is a 41 y.o. female here for dive physical.  Patient has history of diving dating back to 2001. No issues with diving. She was recently started on rituxan for Sjogrens and MOGAD. Other than eyes being dry at times, denies increased pain of eyes, vision changes when diving. Patient denies history of seizures, barotrauma, pneumothorax, chronic sinus problems, asthma, panic attacks. No history of neurologic disorders, cancer, hearing difficulty.  Past Medical History:  Diagnosis Date   Optic neuritis    Sjogren's syndrome (HCC)     Current Outpatient Medications on File Prior to Visit  Medication Sig Dispense Refill   Multiple Vitamin (MULTIVITAMIN) tablet Take 1 tablet by mouth daily.     PARAGARD INTRAUTERINE COPPER IU by Intrauterine route.     No current facility-administered medications on file prior to visit.    Past Surgical History:  Procedure Laterality Date   DILATION AND CURETTAGE OF UTERUS  2011    Allergies  Allergen Reactions   Plaquenil [Hydroxychloroquine]     rash    Social History   Socioeconomic History   Marital status: Married    Spouse name: Not on file   Number of children: 2   Years of education: Not on file   Highest education level: Not on file  Occupational History   Not on file  Tobacco Use   Smoking status: Never   Smokeless tobacco: Never  Vaping Use   Vaping Use: Never used  Substance and Sexual Activity   Alcohol use: Yes    Comment: occasionally    Drug use: Never   Sexual activity: Yes    Birth control/protection: Condom, I.U.D.    Comment: Paragard IUD inserted 11/27/18  Other Topics Concern   Not on file  Social History Narrative   Not on file   Social Determinants of Health   Financial Resource Strain: Not on file  Food Insecurity: Not on file  Transportation Needs: Not on file  Physical Activity: Not on file  Stress: Not on file  Social Connections: Not  on file  Intimate Partner Violence: Not on file    Family History  Problem Relation Age of Onset   Diabetes Mother    Colon polyps Mother    Hyperlipidemia Father    High blood pressure Father    Breast cancer Paternal Grandmother        Post menopause   Heart attack Paternal Grandfather     BP 110/70   Ht 5\' 6"  (1.676 m)   Wt 155 lb (70.3 kg)   BMI 25.02 kg/m       View : No data to display.              View : No data to display.          Review of Systems: See HPI above.     Objective:  Physical Exam:  Gen: NAD, comfortable in exam room  HEENT: PERRL, EOMI.  Visual fields full.  TMs and pharynx normal. CV: RRR no MRG seated or standing Lungs: CTAB without wheezes, rales, or rhonchi Abd: soft, nt, nd.  No HSM. Neuro: CN 2-12 grossly intact Strength 5/5 upper and lower extremities Sensation intact to light touch throughout MSRs 2+ biceps, triceps, brachioradialis, patellar, and achilles tendons. Romberg negative Double leg stance with 0 errors Tandem stance with 0 errors Single leg stance with 1 error. Finger to nose normal bilaterally Vertical and horizontal saccades, fixed gaze  with head rotation 20 trials without symptoms or nystagmus   CXR: normal without abnormalities. EKG: normal sinus rhythm without abnormalities. Hb: 11.5 Glucose: 94 Urine protein and glucose: negative  Assessment & Plan:  1. Dive physical - There are no disqualifying medical conditions found to restrict her participation from scuba diving.  Forms filled out and will also be scanned into chart.

## 2021-07-04 ENCOUNTER — Encounter: Payer: Self-pay | Admitting: Family Medicine

## 2021-09-04 NOTE — Progress Notes (Deleted)
Office Visit Note  Patient: Joyce [name]             Date of Birth: 1980-09-26           MRN: [id number]             PCP: Sunnie Nielsen, DO Referring: Sunnie Nielsen, DO Visit Date: 09/11/2021   Subjective:  No chief complaint on file.   History of Present Illness: Joyce [name] is a 41 y.o. female here for follow up sjogren's syndrome she started hydroxychloroquine after last visit but developed new skin rashes so stopped this medication.   Previous HPI 03/13/2021 Joyce [name] is a 41 y.o. female here for follow up sjogren's syndrome she started hydroxychloroquine after last visit but developed new skin rashes so stopped this medication.  Skin rash started about 3 weeks after starting the medicine and fully resolved within about 3 weeks after stopping.  Her biggest problem continues to be symptoms with her eyes which are dry and sometimes painful.  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  The pain improves once no longer staring at screens but quickly returns when looking towards the screens.  She uses the artificial tears type drops and has not noticed any other change or problem with visual acuity.  She also has mouth dryness this is less of a concern she drinks a lot of liquid and uses a mouth spray occasionally when needed. She reports her diarrhea resolves once starting the hydroxychloroquine and this has stayed improved despite discontinuing the medicine.  She reports some weight gain attributes this to eating out. She experiences a burning type of pain on the left thigh lasting about 2 weeks followed by about 1 week on the right upper arm the symptom has not recurred.  She is having some thigh pain and stiffness in the mornings bilaterally.     Previous HPI 01/03/21 Joyce [name] is a 41 y.o. female here for follow up for sjogren's syndrome. She had interval visits with Dr. Sherryll Burger findings  consistent with dry eyes but no evidence of neuritis or papilledema. She experienced episode of left lateral thigh pain lasting at least 2 weeks with burning sensation and worse with walking and sensation such as pants rubbing against skin. She also experienced an episode of pain on the back of the neck that then spontaneously resolved. She has noticed some increased hair coming out with washing and brushing although no focal thin or bald spots noticed. She has no visible skin changes on any of the affected areas. She continues to notice salivary gland swelling intermittently. She also reports persistent symptom of diarrhea that is ongoing for about 8 months having around 3 loose bowel movements daily. She is taking imodium daily for these symptoms. She denies any blood or mucus in stools. She has a family history of colonic polyps but no malignancy or inflammatory bowel disease.   Previous HPI 10/12/20 Joyce [name] is a 41 y.o. female here for evaluation of positive autoimmune serology checked in association with optic neuritis and mild asymmetric lacrimal gland size.  Symptoms started around July with right eye pain particularly with movement after symptoms persisted for 10 days he went for evaluation.  We will send to her work-up including eye exam demonstrating swelling around the optic nerve and MRI that was largely unremarkable possible small lacrimal gland enlargement.  She did not start any particular treatments or intervention  for this symptoms did improve.  She started having symptoms on the left side last week and went for eye exam on Friday felt the findings were consistent with those on the right but at earlier stage. Besides this she did have an episode of right cheek and jaw pain a week ago improved spontaneously without treatment.  She has noticed new headaches typically start posteriorly and are exacerbated with forward bending leading to a bilateral frontal pounding type of pain.  She denies  any sensation of dry eyes or itching does sometimes feel a dry mouth.   Labs reviewed 2022 ANA pos SSA >8.0 SSB >8.0 RF 66 IgG 3800 Complement C4 12  / No Rheumatology ROS completed.   PMFS History:  Patient Active Problem List   Diagnosis Date Noted   Encounter for other specified special examinations 06/27/2021   Activity, underwater diving and snorkeling 06/27/2021   Pain around right eye 03/13/2021   Neuropathic pain of thigh, left 01/03/2021   Sjogren syndrome, unspecified (HCC) 10/12/2020   Facial swelling 10/12/2020   Left optic neuritis 10/07/2020   Right optic neuritis 08/23/2020   Optic nerve edema 08/23/2020   Activities involving scuba diving 06/14/2017    Past Medical History:  Diagnosis Date   Optic neuritis    Sjogren's syndrome (HCC)     Family History  Problem Relation Age of Onset   Diabetes Mother    Colon polyps Mother    Hyperlipidemia Father    High blood pressure Father    Breast cancer Paternal Grandmother        Post menopause   Heart attack Paternal Grandfather    Past Surgical History:  Procedure Laterality Date   DILATION AND CURETTAGE OF UTERUS  2011   Social History   Social History Narrative   Not on file   Immunization History  Administered Date(s) Administered   Influenza,inj,Quad PF,6+ Mos 09/15/2018, 09/15/2018   Tdap 11/05/2016     Objective: Vital Signs: There were no vitals taken for this visit.   Physical Exam   Musculoskeletal Exam: ***  CDAI Exam: CDAI Score: -- Patient Global: --; Provider Global: -- Swollen: --; Tender: -- Joint Exam 09/11/2021   No joint exam has been documented for this visit   There is currently no information documented on the homunculus. Go to the Rheumatology activity and complete the homunculus joint exam.  Investigation: No additional findings.  Imaging: No results found.  Recent Labs: Lab Results  Component Value Date   WBC 3.6 04/04/2021   HGB 11.5 06/28/2021    PLT 203 04/04/2021   NA 136 11/17/2018   K 3.9 11/17/2018   CL 104 11/17/2018   CO2 24 11/17/2018   GLUCOSE 83 11/17/2018   BUN 11 11/17/2018   CREATININE 0.87 11/17/2018   BILITOT 0.5 04/04/2021   ALKPHOS 58 04/04/2021   AST 21 04/04/2021   ALT 17 04/04/2021   PROT 8.4 04/04/2021   ALBUMIN 4.3 04/04/2021   CALCIUM 9.3 11/17/2018   GFRAA 98 11/17/2018   QFTBGOLDPLUS Negative 04/04/2021    Speciality Comments: No specialty comments available.  Procedures:  No procedures performed Allergies: Plaquenil [hydroxychloroquine]   Assessment / Plan:     Visit Diagnoses: No diagnosis found.  ***  Orders: No orders of the defined types were placed in this encounter.  No orders of the defined types were placed in this encounter.    Follow-Up Instructions: No follow-ups on file.   Jairo Ben, RT  Note -  This record has been created using Dragon software.  Chart creation errors have been sought, but may not always  have been located. Such creation errors do not reflect on  the standard of medical care.  

## 2021-09-11 ENCOUNTER — Ambulatory Visit: Payer: 59 | Admitting: Internal Medicine

## 2021-09-11 DIAGNOSIS — H5711 Ocular pain, right eye: Secondary | ICD-10-CM

## 2021-09-11 DIAGNOSIS — M3500 Sicca syndrome, unspecified: Secondary | ICD-10-CM

## 2021-09-11 DIAGNOSIS — M792 Neuralgia and neuritis, unspecified: Secondary | ICD-10-CM

## 2021-10-02 ENCOUNTER — Telehealth: Payer: Self-pay | Admitting: Neurology

## 2021-10-02 NOTE — Telephone Encounter (Signed)
MR Brain w/wo contrast Dr. Epimenio Foot [facility]: [id number] (exp. 10/02/21 to 11/16/21)   Patient is scheduled at [facility] For 10/03/21. At 4 pm.

## 2021-10-03 ENCOUNTER — Ambulatory Visit (INDEPENDENT_AMBULATORY_CARE_PROVIDER_SITE_OTHER): Payer: 59

## 2021-10-03 DIAGNOSIS — M3500 Sicca syndrome, unspecified: Secondary | ICD-10-CM

## 2021-10-03 DIAGNOSIS — H469 Unspecified optic neuritis: Secondary | ICD-10-CM | POA: Diagnosis not present

## 2021-10-03 MED ORDER — GADOBENATE DIMEGLUMINE 529 MG/ML IV SOLN
15.0000 mL | Freq: Once | INTRAVENOUS | Status: AC | PRN
  Administered 2021-10-03: 15 mL via INTRAVENOUS

## 2021-10-04 NOTE — Progress Notes (Signed)
Office Visit Note  Patient: Joyce [name]             Date of Birth: [birth year]           MRN: [id number]             PCP: Oneita Hurt No Referring: Sunnie Nielsen, DO Visit Date: 10/11/2021   Subjective:  Follow-up (Dx with MOGADS. Since starting treatment some weight gain, concerned if it's due to new meds. )   History of Present Illness: Joyce [name] is a 41 y.o. female here for follow up for sjogren syndrome. Since our last visit she saw Dr. 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 Since starting the medication she reports a great improvement in dry eyes and mouth symptoms, no longer requiring drops or constant stimulation of saliva. She has no new infections since starting, including when her son was ill with COVID at home. She has gained about 15 pounds unintentionally, previously was very stable at about 150 lbs with little variation over years except during pregnancy.  Previous HPI 03/13/2021 Joyce [name] is a 41 y.o. female here for follow up sjogren's syndrome she started hydroxychloroquine after last visit but developed new skin rashes so stopped this medication.  Skin rash started about 3 weeks after starting the medicine and fully resolved within about 3 weeks after stopping.  Her biggest problem continues to be symptoms with her eyes which are dry and sometimes painful.  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  The pain improves once no longer staring at screens but quickly returns when looking towards the screens.  She uses the artificial tears type drops and has not noticed any other change or problem with visual acuity.  She also has mouth dryness this is less of a concern she drinks a lot of liquid and uses a mouth spray occasionally when needed. She reports her diarrhea resolves once starting  the hydroxychloroquine and this has stayed improved despite discontinuing the medicine.  She reports some weight gain attributes this to eating out. She experiences a burning type of pain on the left thigh lasting about 2 weeks followed by about 1 week on the right upper arm the symptom has not recurred.  She is having some thigh pain and stiffness in the mornings bilaterally.     Previous HPI 01/03/21 Joyce [name] is a 41 y.o. female here for follow up for sjogren's syndrome. She had interval visits with Dr. Sherryll Burger findings consistent with dry eyes but no evidence of neuritis or papilledema. She experienced episode of left lateral thigh pain lasting at least 2 weeks with burning sensation and worse with walking and sensation such as pants rubbing against skin. She also experienced an episode of pain on the back of the neck that then spontaneously resolved. She has noticed some increased hair coming out with washing and brushing although no focal thin or bald spots noticed. She has no visible skin changes on any of the affected areas. She continues to notice salivary gland swelling intermittently. She also reports persistent symptom of diarrhea that is ongoing for about 8 months having around 3 loose bowel movements daily. She is taking imodium daily for these symptoms. She denies any blood or mucus in stools. She has a family history of colonic polyps but no malignancy or inflammatory bowel  disease.   Previous HPI 10/12/20 Joyce [name] is a 41 y.o. female here for evaluation of positive autoimmune serology checked in association with optic neuritis and mild asymmetric lacrimal gland size.  Symptoms started around July with right eye pain particularly with movement after symptoms persisted for 10 days he went for evaluation.  We will send to her work-up including eye exam demonstrating swelling around the optic nerve and MRI that was largely unremarkable possible small lacrimal gland enlargement.  She did  not start any particular treatments or intervention for this symptoms did improve.  She started having symptoms on the left side last week and went for eye exam on Friday felt the findings were consistent with those on the right but at earlier stage. Besides this she did have an episode of right cheek and jaw pain a week ago improved spontaneously without treatment.  She has noticed new headaches typically start posteriorly and are exacerbated with forward bending leading to a bilateral frontal pounding type of pain.  She denies any sensation of dry eyes or itching does sometimes feel a dry mouth.   Labs reviewed 2022 ANA pos SSA >8.0 SSB >8.0 RF 66 IgG 3800 Complement C4 12     Review of Systems  Constitutional:  Negative for fatigue.  HENT:  Negative for mouth sores and mouth dryness.   Eyes:  Negative for dryness.  Respiratory:  Negative for shortness of breath.   Cardiovascular:  Negative for chest pain and palpitations.  Gastrointestinal:  Negative for blood in stool, constipation and diarrhea.  Endocrine: Negative for increased urination.  Genitourinary:  Negative for involuntary urination.  Musculoskeletal:  Negative for joint pain, gait problem, joint pain, joint swelling, myalgias, muscle weakness, morning stiffness, muscle tenderness and myalgias.  Skin:  Negative for color change, rash, hair loss and sensitivity to sunlight.  Allergic/Immunologic: Negative for susceptible to infections.  Neurological:  Negative for dizziness and headaches.  Hematological:  Negative for swollen glands.  Psychiatric/Behavioral:  Negative for depressed mood and sleep disturbance. The patient is not nervous/anxious.     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/23/2020   Activities involving scuba diving 06/14/2017    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)    Optic neuritis    Sjogren's syndrome (HCC)     Family History  Problem Relation Age of Onset   Diabetes Mother    Colon polyps Mother    Hyperlipidemia Father    High blood pressure Father    Breast cancer Paternal Grandmother        Post menopause   Heart attack Paternal Grandfather    Past Surgical History:  Procedure Laterality Date   DILATION AND CURETTAGE OF UTERUS  2011   Social History   Social History Narrative   Not on file   Immunization History  Administered Date(s) Administered   Influenza,inj,Quad PF,6+ Mos 09/15/2018, 09/15/2018   Tdap 11/05/2016     Objective: Vital Signs: BP 114/77 (BP Location: Right Arm, Patient Position:  Sitting, Cuff Size: Normal)   Pulse 65   Resp 15   Ht 5\' 6"  (1.676 m)   Wt 165 lb 3.2 oz (74.9 kg)   LMP  (Within Weeks)   BMI 26.66 kg/m    Physical Exam HENT:     Mouth/Throat:     Mouth: Mucous membranes are moist.     Pharynx: Oropharynx is clear.  Eyes:     Conjunctiva/sclera: Conjunctivae normal.     Comments: Bilateral pinguecaulae present, no injection and no periorbital swelling  Cardiovascular:     Rate and Rhythm: Normal rate and regular rhythm.  Pulmonary:     Effort: Pulmonary effort is normal.     Breath sounds: Normal breath sounds.  Musculoskeletal:     Right lower leg: No edema.     Left lower leg: No edema.  Lymphadenopathy:     Cervical: No cervical adenopathy.  Skin:    General: Skin is warm and dry.     Findings: No rash.  Neurological:     Mental Status: She is alert.     Motor: No  weakness.     Deep Tendon Reflexes: Reflexes normal.  Psychiatric:        Mood and Affect: Mood normal.      Musculoskeletal Exam:  Shoulders full ROM no tenderness or swelling Elbows full ROM no tenderness or swelling Wrists full ROM no tenderness or swelling Fingers full ROM no tenderness or swelling Knees full ROM no tenderness or swelling Ankles full ROM no tenderness or swelling   Investigation: No additional findings.  Imaging: MR BRAIN W WO CONTRAST  Result Date: 10/03/2021  [facility] [address] [place], [place] [id number] NEUROIMAGING REPORT STUDY DATE: 10/03/2021 PATIENT NAME: Carreen Milius DOB: 05-Aug-1980 MRN: [id number] ORDERING CLINICIAN: Dr. [id number] CLINICAL HISTORY: 41 year old patient being evaluated for bilateral optic neuritis and history of Sjogren syndrome COMPARISON FILMS: MRI scan of the brain with and without contrast 09/03/2020 EXAM: MRI brain with and without contrast TECHNIQUE: Sagittal T1, FLAIR, axial T1, T2, FLAIR, DWI, ADC map, GRE, coronal T2 and postcontrast axial and coronal T1 images were obtained through the brain CONTRAST: 15 mL IV MultiHance IMAGING SITE: [facility] imaging at 09/05/2020 FINDINGS: The brain parenchyma shows no abnormal signal intensities.  No structural lesion, tumor or infarct is noted.  Diffusion-weighted imaging is negative for acute ischemia.  GRE images do not show any microhemorrhages.  Subarachnoid spaces and ventricular system appear normal.  Cortical sulci and gyri show normal appearance.  Extra-axial brain structures appear normal.  Calvarium shows no abnormalities.  Orbits appear unremarkable.  Paranasal sinuses show only minor mucosal thickening.  The pituitary gland appears normal cerebellar tonsils are slightly low-lying 3 mm below foramen magnum without significant compression.  Flow-voids of large vessels of intracranial circulation appear to be patent.  Visualized portion of the cervical spine  show only minor disc degenerative changes.  Postcontrast images do not result in abnormal areas of enhancement.   Unremarkable MRI scan of the brain with and without contrast. INTERPRETING PHYSICIAN: PRAMOD SETHI, MD Certified in  Neuroimaging by American Society of Neuroimaging and [employer] for Neurological Subspecialities   Recent Labs: Lab Results  Component Value Date   WBC 3.6 04/04/2021   HGB 11.5 06/28/2021   PLT 203 04/04/2021   NA 136 11/17/2018   K 3.9 11/17/2018   CL 104 11/17/2018   CO2 24 11/17/2018   GLUCOSE 83 11/17/2018   BUN 11 11/17/2018  CREATININE 0.87 11/17/2018   BILITOT 0.5 04/04/2021   ALKPHOS 58 04/04/2021   AST 21 04/04/2021   ALT 17 04/04/2021   PROT 8.4 04/04/2021   ALBUMIN 4.3 04/04/2021   CALCIUM 9.3 11/17/2018   GFRAA 98 11/17/2018   QFTBGOLDPLUS Negative 04/04/2021    Speciality Comments: No specialty comments available.  Procedures:  No procedures performed Allergies: Plaquenil [hydroxychloroquine]   Assessment / Plan:     Visit Diagnoses: Sjogren syndrome, unspecified (HCC) - Plan: Rheumatoid factor, C3 and C4  Eye and mouth symptoms are dramatically improved, no longer even requiring supportive treatments. Her neuropathic pain complaint also doing better. No recurrence of facial swelling. She had recent MRI brain which was normal. Plan to repeat her abnormal RF and complement levels for monitoring, ideally would see normalization. Agree with continuing rituximab treatment currently ordered through Dr. Bonnita Hollow office.  Neuropathic pain of thigh, left  Symptoms are improved, no complaint today.  Pain around right eye  Eye pain and dryness much improved since starting treatment, no obvious inflammation on exam today.  High risk medication use - Plan: CBC with Differential/Platelet, COMPLETE METABOLIC PANEL WITH GFR, IgG, IgA, IgM  Checking CBC, CMP, and immunoglobulin levels today for rituximab medication monitoring. She  fortunately did not become ill with COVID while her son was sick. Weight gain is not a typical effect of rituximab although the coadministration of steroids could be contributory.  Orders: Orders Placed This Encounter  Procedures   CBC with Differential/Platelet   COMPLETE METABOLIC PANEL WITH GFR   IgG, IgA, IgM   Rheumatoid factor   C3 and C4   No orders of the defined types were placed in this encounter.    Follow-Up Instructions: Return in about 6 months (around 04/11/2022).   Fuller Plan, MD  Note - This record has been created using [employer].  Chart creation errors have been sought, but may not always  have been located. Such creation errors do not reflect on  the standard of medical care.

## 2021-10-11 ENCOUNTER — Encounter: Payer: Self-pay | Admitting: Internal Medicine

## 2021-10-11 ENCOUNTER — Ambulatory Visit: Payer: 59 | Attending: Internal Medicine | Admitting: Internal Medicine

## 2021-10-11 VITALS — BP 114/77 | HR 65 | Resp 15 | Ht 66.0 in | Wt 165.2 lb

## 2021-10-11 DIAGNOSIS — H5711 Ocular pain, right eye: Secondary | ICD-10-CM | POA: Diagnosis not present

## 2021-10-11 DIAGNOSIS — Z79899 Other long term (current) drug therapy: Secondary | ICD-10-CM | POA: Diagnosis not present

## 2021-10-11 DIAGNOSIS — M792 Neuralgia and neuritis, unspecified: Secondary | ICD-10-CM

## 2021-10-11 DIAGNOSIS — M3500 Sicca syndrome, unspecified: Secondary | ICD-10-CM

## 2021-10-12 LAB — CBC WITH DIFFERENTIAL/PLATELET
Absolute Monocytes: 610 cells/uL (ref 200–950)
Basophils Absolute: 29 cells/uL (ref 0–200)
Basophils Relative: 0.6 %
Eosinophils Absolute: 38 cells/uL (ref 15–500)
Eosinophils Relative: 0.8 %
HCT: 37.7 % (ref 35.0–45.0)
Hemoglobin: 12.2 g/dL (ref 11.7–15.5)
Lymphs Abs: 984 cells/uL (ref 850–3900)
MCH: 27.8 pg (ref 27.0–33.0)
MCHC: 32.4 g/dL (ref 32.0–36.0)
MCV: 85.9 fL (ref 80.0–100.0)
MPV: 10.9 fL (ref 7.5–12.5)
Monocytes Relative: 12.7 %
Neutro Abs: 3139 cells/uL (ref 1500–7800)
Neutrophils Relative %: 65.4 %
Platelets: 213 10*3/uL (ref 140–400)
RBC: 4.39 10*6/uL (ref 3.80–5.10)
RDW: 12.7 % (ref 11.0–15.0)
Total Lymphocyte: 20.5 %
WBC: 4.8 10*3/uL (ref 3.8–10.8)

## 2021-10-12 LAB — COMPLETE METABOLIC PANEL WITH GFR
AG Ratio: 1.1 (calc) (ref 1.0–2.5)
ALT: 60 U/L — ABNORMAL HIGH (ref 6–29)
AST: 52 U/L — ABNORMAL HIGH (ref 10–30)
Albumin: 4.2 g/dL (ref 3.6–5.1)
Alkaline phosphatase (APISO): 59 U/L (ref 31–125)
BUN: 12 mg/dL (ref 7–25)
CO2: 26 mmol/L (ref 20–32)
Calcium: 9.4 mg/dL (ref 8.6–10.2)
Chloride: 103 mmol/L (ref 98–110)
Creat: 0.72 mg/dL (ref 0.50–0.99)
Globulin: 3.9 g/dL (calc) — ABNORMAL HIGH (ref 1.9–3.7)
Glucose, Bld: 72 mg/dL (ref 65–99)
Potassium: 4 mmol/L (ref 3.5–5.3)
Sodium: 137 mmol/L (ref 135–146)
Total Bilirubin: 0.4 mg/dL (ref 0.2–1.2)
Total Protein: 8.1 g/dL (ref 6.1–8.1)
eGFR: 108 mL/min/{1.73_m2} (ref >=60)

## 2021-10-12 LAB — IGG, IGA, IGM
IgG (Immunoglobin G), Serum: 2582 mg/dL — ABNORMAL HIGH (ref 600–1640)
IgM, Serum: 154 mg/dL (ref 50–300)
Immunoglobulin A: 138 mg/dL (ref 47–310)

## 2021-10-12 LAB — C3 AND C4
C3 Complement: 123 mg/dL (ref 83–193)
C4 Complement: 12 mg/dL — ABNORMAL LOW (ref 15–57)

## 2021-10-12 LAB — RHEUMATOID FACTOR: Rheumatoid fact SerPl-aCnc: 18 IU/mL — ABNORMAL HIGH (ref <14)

## 2021-10-12 NOTE — Progress Notes (Signed)
Lab test shows an increase in liver enzyme markers. This can be associated with liver problems although her numbers are only mildly high and may be incidental. I am not aware of rituximab causing this problem unless people have preexisting liver disease. Has she recently used a large amount of tylenol or ibuprofen or increased alcohol consumption? I recommend she return for a lab visit sometime between 2-4 weeks from now to recheck this though, since I do not have an explanation for it.  Her immunoglobulin level is partially decreased but still very high. Rheumatoid factor is also partially decreased but still high. I think this indicates response to treatment so far but definitely not disease remission yet.

## 2021-10-13 ENCOUNTER — Encounter: Payer: Self-pay | Admitting: *Deleted

## 2021-11-07 ENCOUNTER — Other Ambulatory Visit (INDEPENDENT_AMBULATORY_CARE_PROVIDER_SITE_OTHER): Payer: Self-pay

## 2021-11-07 ENCOUNTER — Other Ambulatory Visit: Payer: Self-pay

## 2021-11-07 DIAGNOSIS — Z0289 Encounter for other administrative examinations: Secondary | ICD-10-CM

## 2021-11-07 DIAGNOSIS — Z79899 Other long term (current) drug therapy: Secondary | ICD-10-CM

## 2021-11-07 DIAGNOSIS — R7989 Other specified abnormal findings of blood chemistry: Secondary | ICD-10-CM

## 2021-11-08 ENCOUNTER — Telehealth: Payer: Self-pay | Admitting: Internal Medicine

## 2021-11-08 LAB — COMPLETE METABOLIC PANEL WITH GFR
AG Ratio: 1.2 (calc) (ref 1.0–2.5)
ALT: 42 U/L — ABNORMAL HIGH (ref 6–29)
AST: 40 U/L — ABNORMAL HIGH (ref 10–30)
Albumin: 4.3 g/dL (ref 3.6–5.1)
Alkaline phosphatase (APISO): 55 U/L (ref 31–125)
BUN: 15 mg/dL (ref 7–25)
CO2: 27 mmol/L (ref 20–32)
Calcium: 9.3 mg/dL (ref 8.6–10.2)
Chloride: 102 mmol/L (ref 98–110)
Creat: 0.78 mg/dL (ref 0.50–0.99)
Globulin: 3.7 g/dL (calc) (ref 1.9–3.7)
Glucose, Bld: 81 mg/dL (ref 65–99)
Potassium: 4.2 mmol/L (ref 3.5–5.3)
Sodium: 137 mmol/L (ref 135–146)
Total Bilirubin: 0.3 mg/dL (ref 0.2–1.2)
Total Protein: 8 g/dL (ref 6.1–8.1)
eGFR: 98 mL/min/{1.73_m2} (ref >=60)

## 2021-11-08 NOTE — Telephone Encounter (Signed)
Addressed in associated result note.

## 2021-11-08 NOTE — Progress Notes (Signed)
Repeat test showed liver function tests are abnormal but improving compared to a month earlier. I think it is fine to just monitor for now since these are just slightly abnormal. If it remains abnormal in the future I would consider getting a RUQ ultrasound as a next step.

## 2021-11-08 NOTE — Addendum Note (Signed)
Addended by: Collier Salina on: 11/08/2021 10:05 AM   Modules accepted: Orders

## 2021-11-08 NOTE — Progress Notes (Signed)
I spoke with Joyce [name] I think most likely cause of these lab abnormalities could be from the rituximab.  Review of the some literature suggest 10 to 15% of treated patients will demonstrate some degree of liver function abnormality.  This is typically not significant and improved after treatment or with repeated cycles of treatment.  Less likely but possible could be fatty liver disease changes associated with increase in weight probably contributed by steroids.  I recommend we can recheck liver function test about 2 weeks following the neck cycle of infusions if this is a medication related effect with expected to see a higher elevation again at that time.

## 2021-12-18 ENCOUNTER — Ambulatory Visit (INDEPENDENT_AMBULATORY_CARE_PROVIDER_SITE_OTHER): Payer: 59 | Admitting: Neurology

## 2021-12-18 ENCOUNTER — Encounter: Payer: Self-pay | Admitting: Neurology

## 2021-12-18 VITALS — BP 108/68 | HR 69 | Ht 66.0 in | Wt 156.5 lb

## 2021-12-18 DIAGNOSIS — G3781 Myelin oligodendrocyte glycoprotein antibody disease: Secondary | ICD-10-CM

## 2021-12-18 DIAGNOSIS — M25551 Pain in right hip: Secondary | ICD-10-CM | POA: Diagnosis not present

## 2021-12-18 DIAGNOSIS — M3500 Sicca syndrome, unspecified: Secondary | ICD-10-CM

## 2021-12-18 DIAGNOSIS — H469 Unspecified optic neuritis: Secondary | ICD-10-CM

## 2021-12-18 NOTE — Progress Notes (Signed)
[facility]  PATIENT: Joyce [name] DOB: 1980-07-16  REFERRING DOCTOR OR PCP: Rona Ravens, OD; Emeterio Reeve (PCP) SOURCE: Patient, notes from optometry.  _________________________________   HISTORICAL  CHIEF COMPLAINT:  Chief Complaint  Patient presents with   Follow-up    Pt in room #11 and alone. Pt here today to f/u with MS.    HISTORY OF PRESENT ILLNESS:  Joyce [name] is a 41 y.o. woman with eye pain and possible optic neuritis.  UPDATE11/13/2023: She had her first Rituxan May 2023 and is scheduled for her next infusion later this month.   She tolerated it well and felt her Sjogren's syndrome symptoms improved afterwards.  She does note that over the last couple weeks she has had some eye pain and eye dryness as well as noticing her salivary gland enlargement more  The episodes of left and right optic neuritis occurred at different times.  She has not had a spinal cord syndrome.  Since the last visit, she had MRI brain and orbits.   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   Orbit MRI showed normal orbits though the lacrimal glands were asymmetric.    Signal was normal.     She denies any history of numbness, clumsiness, weakness or visual change consistent with demyelination.  She has no neuro-imaging studies.      She had positive ANA and ENA showed SSA and SSB antibodies.   RA also elevated.   She saw rheumatology (Dr. Benjamine Mola) and diagnosed with Sjogren's.  Plaquenil was started but she had a rash and it was discontinued.   After her diagnosis MOGAD, I had discussed treatment options with Dr. Benjamine Mola in the decided on Rituxan.  Repeat visit to ophthalmology, Dr. Dwana Melena [facility]) showed no disc edema or optic neuritis,     OCT was normal.    Visual History: On 08/04/2020, she had the onset of eye pain .  Pain peaked about a week later and then began to improve.   Movement of the eye worsened the pain.    By 08/15/2020, pain  was only occurring on far lateral gaze.  That day, she saw Dr. Pamelia Hoit.  The dilated eye exam showed a slightly elevated/swollen right optic nerve.  This was confirmed with OCT testing.  At no time did she notice any change in visual acuity, visual field or color vision..      She is otherwise healthy and takes no medications except for multivitamins.    She has no personal history of an autoimmune disorder.  No family history of MS or other autoimmune disorders.  IMAGING: MRI brain 09/03/2020 showed  This is a normal MRI of the brain with and without contrast.  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   MRI orbits 09/03/2020 showed: 1.   No acute findings.  Normal enhancement. 2.   Optic nerves appear normal.   3.   Mild asymmetry of the lacrimal glands, larger on the right.  This could be incidental as the dimensions of the glands are within normal limits.  They have normal signal.  LABS: 08/13/2020:  ANA positive with ENA showing increased SSA and SSB 09/11/2021:  decreased C4 12 (15-57), normal C3, elevated RF=66, increased IgG=3800 (normal IgM and IgA) , elevated IgG4 97.9 (4-86) 11/15/2020: SPEP with increased gamma globulin but no M spike; RA titer = 160 (<10 normal); HIV negative; RPR negative; Lyme  negative, Bartonella negative, anti-CCP negative; HLA,   NMO IgG negative, TSH nornmal, Vit D normal, ESR/CRP normal  REVIEW OF SYSTEMS: Constitutional: No fevers, chills, sweats, or change in appetite Eyes: No visual changes, double vision, eye pain Ear, nose and throat: No hearing loss, ear pain, nasal congestion, sore throat Cardiovascular: No chest pain, palpitations Respiratory:  No shortness of breath at rest or with exertion.   No wheezes GastrointestinaI: No nausea, vomiting, diarrhea, abdominal pain, fecal incontinence Genitourinary:  No dysuria, urinary retention or frequency.  No nocturia. Musculoskeletal:  No neck  pain, back pain Integumentary: No rash, pruritus, skin lesions Neurological: as above Psychiatric: No depression at this time.  No anxiety Endocrine: No palpitations, diaphoresis, change in appetite, change in weigh or increased thirst Hematologic/Lymphatic:  No anemia, purpura, petechiae. Allergic/Immunologic: No itchy/runny eyes, nasal congestion, recent allergic reactions, rashes  ALLERGIES: Allergies  Allergen Reactions   Plaquenil [Hydroxychloroquine]     rash    HOME MEDICATIONS:  Current Outpatient Medications:    Multiple Vitamin (MULTIVITAMIN) tablet, Take 1 tablet by mouth daily., Disp: , Rfl:    PARAGARD INTRAUTERINE COPPER IU, by Intrauterine route., Disp: , Rfl:    riTUXimab (RITUXAN) 100 MG/10ML injection, every 6 (six) months., Disp: , Rfl:   PAST MEDICAL HISTORY: Past Medical History:  Diagnosis Date   Myelin oligodendrocyte glycoprotein antibody disorder (MOGAD)    Optic neuritis    Sjogren's syndrome ([place])     PAST SURGICAL HISTORY: Past Surgical History:  Procedure Laterality Date   DILATION AND CURETTAGE OF UTERUS  2011    FAMILY HISTORY: Family History  Problem Relation Age of Onset   Diabetes Mother    Colon polyps Mother    Hyperlipidemia Father    High blood pressure Father    Breast cancer Paternal Grandmother        Post menopause   Heart attack Paternal Grandfather     SOCIAL HISTORY:  Social History   Socioeconomic History   Marital status: Married    Spouse name: Not on file   Number of children: 2   Years of education: Not on file   Highest education level: Not on file  Occupational History   Not on file  Tobacco Use   Smoking status: Never   Smokeless tobacco: Never  Vaping Use   Vaping Use: Never used  Substance and Sexual Activity   Alcohol use: Yes    Comment: occasionally    Drug use: Never   Sexual activity: Yes    Birth control/protection: Condom, I.U.D.    Comment: Paragard IUD inserted 11/27/18  Other  Topics Concern   Not on file  Social History Narrative   Not on file   Social Determinants of Health   Financial Resource Strain: Not on file  Food Insecurity: Not on file  Transportation Needs: Not on file  Physical Activity: Not on file  Stress: Not on file  Social Connections: Not on file  Intimate Partner Violence: Not on file     PHYSICAL EXAM  Vitals:   12/18/21 1331  BP: 108/68  Pulse: 69  Weight: 156 lb 8 oz (71 kg)  Height: _0  (1.676 m)    Body mass index is 25.26 kg/m.  Visual acuity: OD 20/25 OS 20/20 Symmetric color vision  General: The patient is well-developed and well-nourished and in no acute distress  HEENT:  Head is [place]/AT.  Sclera are anicteric.     Neck: No carotid bruits are noted.  The neck  is nontender.  Cardiovascular: The heart has a regular rate and rhythm with a normal S1 and S2. There were no murmurs, gallops or rubs.    Skin: Extremities are without rash or  edema.  Musculoskeletal:  Back is nontender  Neurologic Exam  Mental status: The patient is alert and oriented x 3 at the time of the examination. The patient has apparent normal recent and remote memory, with an apparently normal attention span and concentration ability.   Speech is normal.  Cranial nerves: Extraocular movements are full.  She had a mild afferent pupillary defect on the right.  Color vision was symmetric..  Facial symmetry is present. There is good facial sensation to soft touch bilaterally.Facial strength is normal.  Trapezius and sternocleidomastoid strength is normal. No dysarthria is noted.  No obvious hearing deficits are noted.  Motor:  Muscle bulk is normal.   Tone is normal. Strength is  5 / 5 in all 4 extremities.   Sensory: Sensory testing is intact to pinprick, soft touch and vibration sensation in all 4 extremities.  Coordination: Cerebellar testing reveals good finger-nose-finger and heel-to-shin bilaterally.  Gait and station: Station is  normal.   Gait is normal. Tandem gait is normal. Romberg is negative.   Reflexes: Deep tendon reflexes are symmetric and normal bilaterally.      DIAGNOSTIC DATA (LABS, IMAGING, TESTING) - I reviewed patient records, labs, notes, testing and imaging myself where available.  Lab Results  Component Value Date   WBC 4.8 10/11/2021   HGB 12.2 10/11/2021   HCT 37.7 10/11/2021   MCV 85.9 10/11/2021   PLT 213 10/11/2021      Component Value Date/Time   NA 137 11/07/2021 1618   K 4.2 11/07/2021 1618   CL 102 11/07/2021 1618   CO2 27 11/07/2021 1618   GLUCOSE 81 11/07/2021 1618   BUN 15 11/07/2021 1618   CREATININE 0.78 11/07/2021 1618   CALCIUM 9.3 11/07/2021 1618   PROT 8.0 11/07/2021 1618   PROT 8.4 04/04/2021 0814   ALBUMIN 4.3 04/04/2021 0814   AST 40 (H) 11/07/2021 1618   ALT 42 (H) 11/07/2021 1618   ALKPHOS 58 04/04/2021 0814   BILITOT 0.3 11/07/2021 1618   BILITOT 0.5 04/04/2021 0814   GFRNONAA 85 11/17/2018 1155   GFRAA 98 11/17/2018 1155   Lab Results  Component Value Date   CHOL 149 11/17/2018   HDL 59 11/17/2018   LDLCALC 74 11/17/2018   TRIG 76 11/17/2018   CHOLHDL 2.5 11/17/2018   Lab Results  Component Value Date   HGBA1C 4.9 07/06/2016   No results found for: "VITAMINB12" Lab Results  Component Value Date   TSH 1.820 08/23/2020        ASSESSMENT AND PLAN  Myelin oligodendrocyte glycoprotein antibody disorder (MOGAD) - Plan: CD20 B Cells, Anti-MOG, Serum  Sjogren syndrome, unspecified ([place]) - Plan: CD20 B Cells  Hip pain, right - Plan: DG HIP UNILAT WITH PELVIS 2-3 VIEWS RIGHT  Left optic neuritis  Right optic neuritis  She had complete resolution of the visual loss from the 2 episodes of optic neuritis.  She has not had a spinal cord syndrome.  Her exam is normal today.  Continue Rituxan.  Check CD20.   If not close to 0 will consider reducing dosing interval to q 5 months or less.    She had elevated LFT and will be having this checked  by Dr. Benjamine Mola again soon.  3.   She will return to see  [place] as needed and call if she has new or worsening neurologic symptoms.    Jakylah Bassinger A. Felecia Shelling, MD, [facility] 68/09/8108, 3:15 PM Certified in Neurology, Clinical Neurophysiology, Sleep Medicine and Neuroimaging  [facility] [address], [place], [place] [address] [phone]

## 2021-12-21 LAB — ANTI-MOG, SERUM: MOG Antibody, Cell-based IFA: POSITIVE — AB

## 2021-12-21 LAB — ANTI-MOG ANTIBODY TITER, SERUM: Anti-MOG Antibody Titer, Serum: 1:10 {titer}

## 2021-12-21 LAB — CD20 B CELLS
% CD19-B Cells: 0.1 % — ABNORMAL LOW (ref 4.6–22.1)
% CD20-B Cells: 0.1 % — ABNORMAL LOW (ref 5.0–22.3)

## 2022-01-03 ENCOUNTER — Ambulatory Visit
Admission: RE | Admit: 2022-01-03 | Discharge: 2022-01-03 | Disposition: A | Discharge status: Home / Self Care | Payer: 59 | Source: Ambulatory Visit | Attending: Neurology | Admitting: Neurology

## 2022-01-03 DIAGNOSIS — M25551 Pain in right hip: Secondary | ICD-10-CM

## 2022-04-11 ENCOUNTER — Ambulatory Visit: Payer: 59 | Admitting: Internal Medicine

## 2022-04-24 NOTE — Progress Notes (Signed)
Office Visit Note  Patient: Joyce [name]             Date of Birth: 08-18-1980           MRN: [id number]             PCP: Pcp, No Referring: No ref. provider found Visit Date: 04/25/2022   Subjective:  Follow-up   History of Present Illness: Joyce [name] is a 42 y.o. 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  Overall symptoms are largely improved with the treatment still gets some eye pain and irritation particularly later in the day he is using Systane drops for this.  She has not lost any of the previous unintentional weight gain so far.  Has been noticing some difficulty with standing up from prolonged stationary position but no specific weakness or instability while walking.  Currently scheduled for next infusions May 1 and May 15.  Has also noticed a small rash spot on her upper abdomen not particularly painful or itchy.   Previous HPI 10/11/21 Joyce [name] is a 42 y.o. female here for follow up for sjogren syndrome. Since our last visit she saw Dr. 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 Since starting the medication she reports a great improvement in dry eyes and mouth symptoms, no longer requiring drops or constant stimulation of saliva. She has no new infections since starting, including when her son was ill with COVID at home. She has gained about 15 pounds unintentionally, previously was very stable at about 150 lbs with little variation over years except during pregnancy.   Previous HPI 03/13/2021 Joyce [name] is a 42 y.o. female here for follow up sjogren's syndrome she started hydroxychloroquine after last visit but developed new skin rashes so stopped this medication.  Skin rash started about 3 weeks after starting the medicine and fully resolved within  about 3 weeks after stopping.  Her biggest problem continues to be symptoms with her eyes which are dry and sometimes painful.  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  The pain improves once no longer staring at screens but quickly returns when looking towards the screens.  She uses the artificial tears type drops and has not noticed any other change or problem with visual acuity.  She also has mouth dryness this is less of a concern she drinks a lot of liquid and uses a mouth spray occasionally when needed. She reports her diarrhea resolves once starting the hydroxychloroquine and this has stayed improved despite discontinuing the medicine.  She reports some weight gain attributes this to eating out. She experiences a burning type of pain on the left thigh lasting about 2 weeks followed by about 1 week on the right upper arm the symptom has not recurred.  She is having some thigh pain and stiffness in the mornings bilaterally.     Previous HPI 01/03/21 Joyce [name] is a 42 y.o. female here for follow up for sjogren's syndrome. She had interval visits with Dr. Sherryll Burger findings consistent with dry eyes but no evidence of neuritis or papilledema. She experienced episode of left lateral thigh pain lasting at least 2 weeks with burning sensation and worse with walking and sensation such  as pants rubbing against skin. She also experienced an episode of pain on the back of the neck that then spontaneously resolved. She has noticed some increased hair coming out with washing and brushing although no focal thin or bald spots noticed. She has no visible skin changes on any of the affected areas. She continues to notice salivary gland swelling intermittently. She also reports persistent symptom of diarrhea that is ongoing for about 8 months having around 3 loose bowel movements daily. She is taking imodium daily for these symptoms. She  denies any blood or mucus in stools. She has a family history of colonic polyps but no malignancy or inflammatory bowel disease.   Previous HPI 10/12/20 Joyce [name] is a 42 y.o. female here for evaluation of positive autoimmune serology checked in association with optic neuritis and mild asymmetric lacrimal gland size.  Symptoms started around July with right eye pain particularly with movement after symptoms persisted for 10 days he went for evaluation.  We will send to her work-up including eye exam demonstrating swelling around the optic nerve and MRI that was largely unremarkable possible small lacrimal gland enlargement.  She did not start any particular treatments or intervention for this symptoms did improve.  She started having symptoms on the left side last week and went for eye exam on Friday felt the findings were consistent with those on the right but at earlier stage. Besides this she did have an episode of right cheek and jaw pain a week ago improved spontaneously without treatment.  She has noticed new headaches typically start posteriorly and are exacerbated with forward bending leading to a bilateral frontal pounding type of pain.  She denies any sensation of dry eyes or itching does sometimes feel a dry mouth.   Labs reviewed 2022 ANA pos SSA >8.0 SSB >8.0 RF 66 IgG 3800 Complement C4 12   Review of Systems  Constitutional:  Negative for fatigue.  HENT:  Negative for mouth sores and mouth dryness.   Eyes:  Positive for dryness.  Respiratory:  Negative for shortness of breath.   Cardiovascular:  Negative for chest pain and palpitations.  Gastrointestinal:  Negative for blood in stool, constipation and diarrhea.  Endocrine: Negative for increased urination.  Genitourinary:  Negative for involuntary urination.  Musculoskeletal:  Positive for muscle weakness. Negative for joint pain, gait problem, joint pain, joint swelling, myalgias, morning stiffness, muscle tenderness and  myalgias.  Skin:  Negative for color change, rash, hair loss and sensitivity to sunlight.  Allergic/Immunologic: Negative for susceptible to infections.  Neurological:  Negative for dizziness and headaches.  Hematological:  Negative for swollen glands.  Psychiatric/Behavioral:  Negative for depressed mood and sleep disturbance. The patient is not nervous/anxious.     PMFS History:  Patient Active Problem List   Diagnosis Date Noted   Encounter for other specified special examinations 06/27/2021   Activity, underwater diving and snorkeling 06/27/2021   Myelin oligodendrocyte glycoprotein antibody disorder (MOGAD) 04/05/2021   Pain around right eye 03/13/2021   Neuropathic pain of thigh, left 01/03/2021   Sjogren syndrome, unspecified (HCC) 10/12/2020   Facial swelling 10/12/2020   Left optic neuritis 10/07/2020   Right optic neuritis 08/23/2020   Optic nerve edema 08/23/2020   Activities involving scuba diving 06/14/2017    Past Medical History:  Diagnosis Date   Myelin oligodendrocyte glycoprotein antibody disorder (MOGAD)    Optic neuritis    Sjogren's syndrome (H[facility])     Family History  Problem Relation Age of Onset  Diabetes Mother    Colon polyps Mother    Hyperlipidemia Father    High blood pressure Father    Breast cancer Paternal Grandmother        Post menopause   Heart attack Paternal Grandfather    Past Surgical History:  Procedure Laterality Date   DILATION AND CURETTAGE OF UTERUS  2011   Social History   Social History Narrative   Not on file   Immunization History  Administered Date(s) Administered   Influenza,inj,Quad PF,6+ Mos 09/15/2018, 09/15/2018   Tdap 11/05/2016     Objective: Vital Signs: BP 103/70 (BP Location: Left Arm, Patient Position: Sitting, Cuff Size: Normal)   Pulse 74   Resp 12   Ht 5\' 6"  (1.676 m)   Wt 171 lb (77.6 kg)   BMI 27.60 kg/m    Physical Exam HENT:     Mouth/Throat:     Mouth: Mucous membranes are moist.      Pharynx: Oropharynx is clear.  Eyes:     Conjunctiva/sclera: Conjunctivae normal.  Cardiovascular:     Rate and Rhythm: Normal rate and regular rhythm.  Pulmonary:     Effort: Pulmonary effort is normal.     Breath sounds: Normal breath sounds.  Musculoskeletal:     Right lower leg: No edema.     Left lower leg: No edema.  Lymphadenopathy:     Cervical: No cervical adenopathy.  Skin:    Comments: Small flat light brown or tan-colored macule  Neurological:     Mental Status: She is alert.  Psychiatric:        Mood and Affect: Mood normal.      Musculoskeletal Exam:  Shoulders full ROM no tenderness or swelling Elbows full ROM no tenderness or swelling Wrists full ROM no tenderness or swelling Fingers full ROM no tenderness or swelling Knees full ROM no tenderness or swelling   Investigation: No additional findings.  Imaging: No results found.  Recent Labs: Lab Results  Component Value Date   WBC 4.8 10/11/2021   HGB 12.2 10/11/2021   PLT 213 10/11/2021   NA 137 11/07/2021   K 4.2 11/07/2021   CL 102 11/07/2021   CO2 27 11/07/2021   GLUCOSE 81 11/07/2021   BUN 15 11/07/2021   CREATININE 0.78 11/07/2021   BILITOT 0.3 11/07/2021   ALKPHOS 58 04/04/2021   AST 40 (H) 11/07/2021   ALT 42 (H) 11/07/2021   PROT 8.0 11/07/2021   ALBUMIN 4.3 04/04/2021   CALCIUM 9.3 11/07/2021   GFRAA 98 11/17/2018   QFTBGOLDPLUS Negative 04/04/2021    Speciality Comments: No specialty comments available.  Procedures:  No procedures performed Allergies: Plaquenil [hydroxychloroquine]   Assessment / Plan:     Visit Diagnoses: Sjogren syndrome, unspecified (HCC) Myelin oligodendrocyte glycoprotein antibody disorder (MOGAD)  Currently symptoms are partially improved with the rituximab not having additional optic complaints and partial benefit with the dryness symptoms.  Unclear if previous LFT elevations directly side effect of the rituximab we will plan to recheck labs  especially CMP at the end of May after the upcoming scheduled infusions.  No object of muscle weakness on exam today to account for the reported difficulty with standing up.  If this starts to get worse or having gait issues may need to consider electrodiagnostic studies or physical therapy evaluation.  Orders: No orders of the defined types were placed in this encounter.  No orders of the defined types were placed in this encounter.    Follow-Up Instructions:  Return in about 6 months (around 10/26/2022) for pSS/MOGAD on RTX f/u 6mos.   Fuller Plan, MD  Note - This record has been created using [employer].  Chart creation errors have been sought, but may not always  have been located. Such creation errors do not reflect on  the standard of medical care.

## 2022-04-25 ENCOUNTER — Ambulatory Visit: Payer: Managed Care, Other (non HMO) | Attending: Internal Medicine | Admitting: Internal Medicine

## 2022-04-25 ENCOUNTER — Encounter: Payer: Self-pay | Admitting: Internal Medicine

## 2022-04-25 VITALS — BP 103/70 | HR 74 | Resp 12 | Ht 66.0 in | Wt 171.0 lb

## 2022-04-25 DIAGNOSIS — G3781 Myelin oligodendrocyte glycoprotein antibody disease: Secondary | ICD-10-CM

## 2022-04-25 DIAGNOSIS — M3500 Sicca syndrome, unspecified: Secondary | ICD-10-CM | POA: Diagnosis not present

## 2022-06-20 ENCOUNTER — Telehealth: Payer: Self-pay | Admitting: Neurology

## 2022-06-20 ENCOUNTER — Ambulatory Visit (INDEPENDENT_AMBULATORY_CARE_PROVIDER_SITE_OTHER): Payer: Managed Care, Other (non HMO) | Admitting: Neurology

## 2022-06-20 ENCOUNTER — Encounter: Payer: Self-pay | Admitting: Neurology

## 2022-06-20 VITALS — BP 127/72 | HR 109 | Ht 66.0 in | Wt 168.2 lb

## 2022-06-20 DIAGNOSIS — G3781 Myelin oligodendrocyte glycoprotein antibody disease: Secondary | ICD-10-CM | POA: Diagnosis not present

## 2022-06-20 DIAGNOSIS — H469 Unspecified optic neuritis: Secondary | ICD-10-CM

## 2022-06-20 DIAGNOSIS — M3500 Sicca syndrome, unspecified: Secondary | ICD-10-CM

## 2022-06-20 DIAGNOSIS — R29898 Other symptoms and signs involving the musculoskeletal system: Secondary | ICD-10-CM | POA: Diagnosis not present

## 2022-06-20 DIAGNOSIS — M62838 Other muscle spasm: Secondary | ICD-10-CM

## 2022-06-20 DIAGNOSIS — Z79899 Other long term (current) drug therapy: Secondary | ICD-10-CM

## 2022-06-20 NOTE — Progress Notes (Signed)
[facility]  PATIENT: Joyce [name] DOB: 05/13/1980  REFERRING DOCTOR OR PCP: Bunnie Pion, OD; Sunnie Nielsen (PCP) SOURCE: Patient, notes from optometry.  _________________________________   HISTORICAL  CHIEF COMPLAINT:  Chief Complaint  Patient presents with   Room 10    Pt is here Alone. Pt states that things have been okay. Pt states that she has a hard time getting up out of her seat when she is getting up. Pt states that she has no problem getting up in a laying down position. Pt states that she has some spots on of her skin under the bra. Pt states that she had gotten her infusion today and she had a hot flash at 2:00 at lunch.     HISTORY OF PRESENT ILLNESS:  Joyce [name] is a 42 y.o. woman with eye pain and possible optic neuritis.  UPDATE 06/20/2022: In November, she noted more issues standing up from a crouching position.   She was able to do this without difficulty before November 2023 when she noted it the first time trying to stand up at a restaurant.    She is able to stand up witout assistance most of the time but crouching down is s till a  rpoblem.  No numbness or bladder change.  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  She denies new visual issues.   She saw ophthalmology and ws told her eye exam was unchanged compared to last visit.  She is noting a lot of leg cramps, right > left.    She notes the muscles stiffens up and takes a couple minutes to relax.   These occur randomly, day > night.   She had her first Rituxan May 2023 most trecent one today.  At the last visit we checked anti-CD20 and CD19 and they were both 0.4 and excellent).  IgG was elevated at 2600 and IgM was normal at 154.  She tolerates Rituxan well and felt her Sjogren's syndrome symptoms  (especially dry mouth  but also eyes) improved afterwards.     She saw ophthalmology and exam was unchanged compared to last visit.     The  episodes of left and right optic neuritis occurred at different times.  She has not had a spinal cord syndrome.  She denies any history of numbness, clumsiness, weakness or visual change consistent with demyelination.  She has no neuro-imaging studies.      She had positive ANA and ENA showed SSA and SSB antibodies.   RA also elevated.   She saw rheumatology (Dr. Dimple Casey) and diagnosed with Sjogren's.  Plaquenil was started but she had a rash and it was discontinued.     She is anti-MOG positive (initial titer was 1: 320 and follow-up titer was down to 1: 10).  Due to her Sjogren's disease, I had a conversation with Dr. Dimple Casey about therapeutic options and we decided to initiate Rituxan.   Repeat visit to ophthalmology, Dr. 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  Visual History: On 08/04/2020, she had the onset of eye pain .  Pain peaked about a week later and then began to improve.   Movement of the eye worsened the pain.    By 08/15/2020, pain was only occurring on far lateral gaze.  That day, she saw Dr. Karie Soda.  The dilated eye exam showed  a slightly elevated/swollen right optic nerve.  This was confirmed with OCT testing.  At no time did she notice any change in visual acuity, visual field or color vision..      She is otherwise healthy and takes no medications except for multivitamins.    She has no personal history of an autoimmune disorder.  No family history of MS or other autoimmune disorders.  IMAGING: MRI brain 09/03/2020 showed  This is a normal MRI of the brain with and without contrast.  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   MRI orbits 09/03/2020 showed: 1.   No acute findings.  Normal enhancement. 2.   Optic nerves appear normal.   3.   Mild asymmetry of the lacrimal glands, larger on the right.  This could be incidental as  the dimensions of the glands are within normal limits.  They have normal signal.  LABS: 08/13/2020:  ANA positive with ENA showing increased SSA and SSB 09/11/2021:  decreased C4 12 (15-57), normal C3, elevated RF=66, increased IgG=3800 (normal IgM and IgA) , elevated IgG4 97.9 (4-86) 11/15/2020: SPEP with increased gamma globulin but no M spike; RA titer = 160 (<10 normal); HIV negative; RPR negative; Lyme negative, Bartonella negative, anti-CCP negative; HLA,   NMO IgG negative, TSH nornmal, Vit D normal, ESR/CRP normal  REVIEW OF SYSTEMS: Constitutional: No fevers, chills, sweats, or change in appetite Eyes: No visual changes, double vision, eye pain Ear, nose and throat: No hearing loss, ear pain, nasal congestion, sore throat Cardiovascular: No chest pain, palpitations Respiratory:  No shortness of breath at rest or with exertion.   No wheezes GastrointestinaI: No nausea, vomiting, diarrhea, abdominal pain, fecal incontinence Genitourinary:  No dysuria, urinary retention or frequency.  No nocturia. Musculoskeletal:  No neck pain, back pain Integumentary: No rash, pruritus, skin lesions Neurological: as above Psychiatric: No depression at this time.  No anxiety Endocrine: No palpitations, diaphoresis, change in appetite, change in weigh or increased thirst Hematologic/Lymphatic:  No anemia, purpura, petechiae. Allergic/Immunologic: No itchy/runny eyes, nasal congestion, recent allergic reactions, rashes  ALLERGIES: Allergies  Allergen Reactions   Plaquenil [Hydroxychloroquine]     rash    HOME MEDICATIONS:  Current Outpatient Medications:    Multiple Vitamin (MULTIVITAMIN) tablet, Take 1 tablet by mouth daily., Disp: , Rfl:    PARAGARD INTRAUTERINE COPPER IU, by Intrauterine route., Disp: , Rfl:    riTUXimab (RITUXAN) 100 MG/10ML injection, every 6 (six) months., Disp: , Rfl:   PAST MEDICAL HISTORY: Past Medical History:  Diagnosis Date   Myelin oligodendrocyte glycoprotein  antibody disorder (MOGAD)    Optic neuritis    Sjogren's syndrome (HCC)     PAST SURGICAL HISTORY: Past Surgical History:  Procedure Laterality Date   DILATION AND CURETTAGE OF UTERUS  2011    FAMILY HISTORY: Family History  Problem Relation Age of Onset   Diabetes Mother    Colon polyps Mother    Hyperlipidemia Father    High blood pressure Father    Breast cancer Paternal Grandmother        Post menopause   Heart attack Paternal Grandfather     SOCIAL HISTORY:  Social History   Socioeconomic History   Marital status: Married    Spouse name: Not on file   Number of children: 2   Years of education: Not on file   Highest education level: Not on file  Occupational History   Not on file  Tobacco Use   Smoking status: Never    Passive exposure: Never   Smokeless tobacco: Never  Vaping Use   Vaping Use: Never used  Substance and Sexual Activity   Alcohol use: Yes    Comment: occasionally    Drug use: Never   Sexual activity: Yes    Birth control/protection: Condom, I.U.D.    Comment: Paragard IUD inserted 11/27/18  Other Topics Concern   Not on file  Social History Narrative   Not on file   Social Determinants of Health   Financial Resource Strain: Not on file  Food Insecurity: Not on file  Transportation Needs: Not on file  Physical Activity: Not on file  Stress: Not on file  Social Connections: Not on file  Intimate Partner Violence: Not on file     PHYSICAL EXAM  Vitals:   06/20/22 1606  BP: 127/72  Pulse: (!) 109  Weight: 168 lb 3.2 oz (76.3 kg)  Height: 5\' 6"  (1.676 m)    Body mass index is 27.15 kg/m.  Visual acuity: OD 20/25 OS 20/20 Symmetric color vision  General: The patient is well-developed and well-nourished and in no acute distress  HEENT:  Head is [place]/AT.  Sclera are anicteric.     Skin: Extremities are without rash or  edema.  Musculoskeletal:  Back is nontender  Neurologic Exam  Mental status: The patient is  alert and oriented x 3 at the time of the examination. The patient has apparent normal recent and remote memory, with an apparently normal attention span and concentration ability.   Speech is normal.  Cranial nerves: Extraocular movements are full.  She had a mild afferent pupillary defect on the right.  Color vision was symmetric..  Facial symmetry is present. There is good facial sensation to soft touch bilaterally.Facial strength is normal.  Trapezius and sternocleidomastoid strength is normal. No dysarthria is noted.  No obvious hearing deficits are noted.  Motor:  Muscle bulk is normal.   Tone is normal. Strength appears to be 5 / 5 in all 4 extremities.  She is able to rise up from a chair without using her arms at least 3 times.  Sensory: Sensory testing is intact to pinprick, soft touch and vibration sensation in all 4 extremities.  Coordination: Cerebellar testing reveals good finger-nose-finger and heel-to-shin bilaterally.  Gait and station: Station is normal.   Gait is normal. Tandem gait is normal. Romberg is negative.   Reflexes: Deep tendon reflexes are symmetric and normal bilaterally.      DIAGNOSTIC DATA (LABS, IMAGING, TESTING) - I reviewed patient records, labs, notes, testing and imaging myself where available.  Lab Results  Component Value Date   WBC 4.8 10/11/2021   HGB 12.2 10/11/2021   HCT 37.7 10/11/2021   MCV 85.9 10/11/2021   PLT 213 10/11/2021      Component Value Date/Time   NA 137 11/07/2021 1618   K 4.2 11/07/2021 1618   CL 102 11/07/2021 1618   CO2 27 11/07/2021 1618   GLUCOSE 81 11/07/2021 1618   BUN 15 11/07/2021 1618   CREATININE 0.78 11/07/2021 1618   CALCIUM 9.3 11/07/2021 1618   PROT 8.0 11/07/2021 1618   PROT 8.4 04/04/2021 0814   ALBUMIN 4.3 04/04/2021 0814   AST 40 (H) 11/07/2021 1618   ALT 42 (H) 11/07/2021 1618   ALKPHOS 58 04/04/2021 0814   BILITOT 0.3 11/07/2021 1618   BILITOT 0.5 04/04/2021 0814   GFRNONAA 85 11/17/2018 1155    GFRAA  98 11/17/2018 1155   Lab Results  Component Value Date   CHOL 149 11/17/2018   HDL 59 11/17/2018   LDLCALC 74 11/17/2018   TRIG 76 11/17/2018   CHOLHDL 2.5 11/17/2018   Lab Results  Component Value Date   HGBA1C 4.9 07/06/2016   No results found for: "VITAMINB12" Lab Results  Component Value Date   TSH 1.820 08/23/2020        ASSESSMENT AND PLAN  Myelin oligodendrocyte glycoprotein antibody disorder (MOGAD) - Plan: MR CERVICAL SPINE W WO CONTRAST, MR THORACIC SPINE W WO CONTRAST, IgG, IgA, IgM, Comprehensive metabolic panel, Thyroid Panel With TSH, Anti-MOG, Serum  Sjogren syndrome, unspecified (HCC)  Left optic neuritis - Plan: Anti-MOG, Serum  Weakness of both lower extremities - Plan: MR CERVICAL SPINE W WO CONTRAST, MR THORACIC SPINE W WO CONTRAST, CK, Comprehensive metabolic panel, Thyroid Panel With TSH, Magnesium, Vitamin B12, Copper, serum, Myasthenia Gravis Profile  Muscle spasm of both lower legs - Plan: MR CERVICAL SPINE W WO CONTRAST, MR THORACIC SPINE W WO CONTRAST, Thyroid Panel With TSH, Magnesium, Vitamin B12, Copper, serum  High risk medication use - Plan: IgG, IgA, IgM, Comprehensive metabolic panel, Thyroid Panel With TSH, Magnesium, Copper, serum  She had complete resolution of the visual loss from the 2 episodes of optic neuritis.  Unfortunately November she has noted some proximal weakness in the legs.  Specifically she has difficulty rising from a crouched.  Her exam did not show significant weakness though she is very active and can tell that there is a difference over the past few months.  We need to check MRI of the cervical and thoracic spine to determine if there has been a longitudinally extensive transverse myelitis consistent with her MOGAD or other abnormality.  If she has a new lesion, combined with her mild but new symptoms, I would need to consider a different disease modifying therapy such as Actemra.  She has not had a spinal cord  syndrome.  Her exam is normal today.  Continue Rituxan.  Check CD20.   Due to the proximal weakness, I will check creatinine kinase, TSH/T4, myasthenia gravis antibodies.  Also check labs for Rituxan. 3.   We discussed that if the spasticity worsens we could try baclofen or tizanidine but she would like to hold off at this time.   4.  She will return to see [place] as needed and call if she has new or worsening neurologic symptoms.   Janeka Libman A. Epimenio Foot, MD, [facility] 06/20/2022, 7:45 PM Certified in Neurology, Clinical Neurophysiology, Sleep Medicine and Neuroimaging  [facility] [address], [address] [place], [place] [address] [phone]

## 2022-06-20 NOTE — Telephone Encounter (Signed)
sent to GI they obtain [employer] auth [id number] 

## 2022-06-22 ENCOUNTER — Encounter: Payer: Self-pay | Admitting: Neurology

## 2022-06-22 LAB — MYASTHENIA GRAVIS PROFILE: AChR Binding Ab, Serum: <0.03 nmol/L (ref 0.00–0.24)

## 2022-06-22 LAB — ANTI-MOG ANTIBODY TITER, SERUM: Anti-MOG Antibody Titer, Serum: 1:32 {titer}

## 2022-06-22 LAB — ANTI-MOG, SERUM: MOG Antibody, Cell-based IFA: POSITIVE — AB

## 2022-06-23 LAB — THYROID PANEL WITH TSH
Free Thyroxine Index: 1.9 (ref 1.2–4.9)
T3 Uptake Ratio: 24 % (ref 24–39)
T4, Total: 8 ug/dL (ref 4.5–12.0)

## 2022-06-23 LAB — MYASTHENIA GRAVIS PROFILE

## 2022-06-27 LAB — MAGNESIUM: Magnesium: 2.1 mg/dL (ref 1.6–2.3)

## 2022-06-27 LAB — COPPER, SERUM: Copper: 100 ug/dL (ref 80–158)

## 2022-07-04 ENCOUNTER — Other Ambulatory Visit: Payer: Self-pay | Admitting: *Deleted

## 2022-07-04 DIAGNOSIS — Z79899 Other long term (current) drug therapy: Secondary | ICD-10-CM

## 2022-07-04 DIAGNOSIS — G3781 Myelin oligodendrocyte glycoprotein antibody disease: Secondary | ICD-10-CM

## 2022-07-04 DIAGNOSIS — R7989 Other specified abnormal findings of blood chemistry: Secondary | ICD-10-CM

## 2022-07-04 DIAGNOSIS — M3500 Sicca syndrome, unspecified: Secondary | ICD-10-CM

## 2022-07-04 DIAGNOSIS — R768 Other specified abnormal immunological findings in serum: Secondary | ICD-10-CM

## 2022-07-04 LAB — CBC WITH DIFFERENTIAL/PLATELET
HCT: 36.4 % (ref 35.0–45.0)
Hemoglobin: 12.1 g/dL (ref 11.7–15.5)
Lymphs Abs: 1387 cells/uL (ref 850–3900)
MCH: 27.6 pg (ref 27.0–33.0)
MCV: 83.1 fL (ref 80.0–100.0)
Neutrophils Relative %: 68.3 %
RDW: 12.5 % (ref 11.0–15.0)

## 2022-07-05 LAB — THYROID PANEL WITH TSH: TSH: 0.851 u[IU]/mL (ref 0.450–4.500)

## 2022-07-05 LAB — CK: Total CK: 164 U/L (ref 32–182)

## 2022-07-05 LAB — MUSK ANTIBODIES: MuSK Antibodies: <1 U/mL

## 2022-07-05 LAB — COMPREHENSIVE METABOLIC PANEL
ALT: 27 IU/L (ref 0–32)
AST: 26 IU/L (ref 0–40)
Albumin/Globulin Ratio: 1.3 (ref 1.2–2.2)
Albumin: 4.4 g/dL (ref 3.9–4.9)
Alkaline Phosphatase: 74 IU/L (ref 44–121)
BUN/Creatinine Ratio: 22 (ref 9–23)
BUN: 17 mg/dL (ref 6–24)
Bilirubin Total: <0.2 mg/dL (ref 0.0–1.2)
CO2: 19 mmol/L — ABNORMAL LOW (ref 20–29)
Calcium: 9.3 mg/dL (ref 8.7–10.2)
Chloride: 104 mmol/L (ref 96–106)
Creatinine, Ser: 0.76 mg/dL (ref 0.57–1.00)
Globulin, Total: 3.5 g/dL (ref 1.5–4.5)
Glucose: 179 mg/dL — ABNORMAL HIGH (ref 70–99)
Potassium: 4.2 mmol/L (ref 3.5–5.2)
Sodium: 138 mmol/L (ref 134–144)
Total Protein: 7.9 g/dL (ref 6.0–8.5)
eGFR: 100 mL/min/{1.73_m2} (ref >59)

## 2022-07-05 LAB — IGG, IGA, IGM
IgA/Immunoglobulin A, Serum: 126 mg/dL (ref 87–352)
IgG (Immunoglobin G), Serum: 1930 mg/dL — ABNORMAL HIGH (ref 586–1602)
IgM (Immunoglobulin M), Srm: 150 mg/dL (ref 26–217)

## 2022-07-05 LAB — MYASTHENIA GRAVIS PROFILE
Acetylchol Block Ab: 17 % (ref 0–25)
Anti-striation Abs: NEGATIVE

## 2022-07-05 LAB — VITAMIN B12: Vitamin B-12: 855 pg/mL (ref 232–1245)

## 2022-07-06 LAB — CBC WITH DIFFERENTIAL/PLATELET
Absolute Monocytes: 861 cells/uL (ref 200–950)
Basophils Absolute: 29 cells/uL (ref 0–200)
Basophils Relative: 0.4 %
Eosinophils Absolute: 37 cells/uL (ref 15–500)
Eosinophils Relative: 0.5 %
MCHC: 33.2 g/dL (ref 32.0–36.0)
MPV: 10.9 fL (ref 7.5–12.5)
Monocytes Relative: 11.8 %
Neutro Abs: 4986 cells/uL (ref 1500–7800)
Platelets: 261 10*3/uL (ref 140–400)
RBC: 4.38 10*6/uL (ref 3.80–5.10)
Total Lymphocyte: 19 %
WBC: 7.3 10*3/uL (ref 3.8–10.8)

## 2022-07-06 LAB — QUANTIFERON-TB GOLD PLUS
Mitogen-NIL: 2.79 IU/mL
NIL: 0.03 IU/mL
QuantiFERON-TB Gold Plus: NEGATIVE
TB1-NIL: 0.02 IU/mL
TB2-NIL: 0.02 IU/mL

## 2022-07-13 ENCOUNTER — Other Ambulatory Visit: Payer: Self-pay

## 2022-07-13 DIAGNOSIS — R7989 Other specified abnormal findings of blood chemistry: Secondary | ICD-10-CM

## 2022-07-13 DIAGNOSIS — Z79899 Other long term (current) drug therapy: Secondary | ICD-10-CM

## 2022-07-13 DIAGNOSIS — R768 Other specified abnormal immunological findings in serum: Secondary | ICD-10-CM

## 2022-07-13 DIAGNOSIS — M3500 Sicca syndrome, unspecified: Secondary | ICD-10-CM

## 2022-07-13 DIAGNOSIS — G3781 Myelin oligodendrocyte glycoprotein antibody disease: Secondary | ICD-10-CM

## 2022-07-14 LAB — COMPLETE METABOLIC PANEL WITH GFR
AG Ratio: 1.3 (calc) (ref 1.0–2.5)
ALT: 19 U/L (ref 6–29)
AST: 21 U/L (ref 10–30)
Albumin: 4.4 g/dL (ref 3.6–5.1)
Alkaline phosphatase (APISO): 57 U/L (ref 31–125)
BUN: 16 mg/dL (ref 7–25)
CO2: 25 mmol/L (ref 20–32)
Calcium: 9.5 mg/dL (ref 8.6–10.2)
Chloride: 106 mmol/L (ref 98–110)
Creat: 0.66 mg/dL (ref 0.50–0.99)
Globulin: 3.3 g/dL (calc) (ref 1.9–3.7)
Glucose, Bld: 79 mg/dL (ref 65–99)
Potassium: 4.8 mmol/L (ref 3.5–5.3)
Sodium: 138 mmol/L (ref 135–146)
Total Bilirubin: 0.3 mg/dL (ref 0.2–1.2)
Total Protein: 7.7 g/dL (ref 6.1–8.1)
eGFR: 112 mL/min/{1.73_m2} (ref >=60)

## 2022-07-19 ENCOUNTER — Encounter: Payer: Self-pay | Admitting: Neurology

## 2022-07-28 ENCOUNTER — Ambulatory Visit
Admission: RE | Admit: 2022-07-28 | Discharge: 2022-07-28 | Disposition: A | Discharge status: Home / Self Care | Payer: Managed Care, Other (non HMO) | Source: Ambulatory Visit | Attending: Neurology | Admitting: Neurology

## 2022-07-28 ENCOUNTER — Other Ambulatory Visit: Payer: Managed Care, Other (non HMO)

## 2022-07-28 DIAGNOSIS — G3781 Myelin oligodendrocyte glycoprotein antibody disease: Secondary | ICD-10-CM

## 2022-07-28 DIAGNOSIS — M62838 Other muscle spasm: Secondary | ICD-10-CM

## 2022-07-28 DIAGNOSIS — R29898 Other symptoms and signs involving the musculoskeletal system: Secondary | ICD-10-CM | POA: Diagnosis not present

## 2022-07-28 MED ORDER — GADOPICLENOL 0.5 MMOL/ML IV SOLN
7.5000 mL | Freq: Once | INTRAVENOUS | Status: AC | PRN
  Administered 2022-07-28: 7.5 mL via INTRAVENOUS

## 2022-08-28 ENCOUNTER — Encounter: Payer: Self-pay | Admitting: Neurology

## 2022-08-28 ENCOUNTER — Telehealth: Payer: Self-pay | Admitting: Neurology

## 2022-08-28 NOTE — Telephone Encounter (Signed)
I returned the call and reached the patient's voicemail. If she calls back, please route to POD 1.

## 2022-08-28 NOTE — Telephone Encounter (Signed)
Spoke to patient traveling to [place] Pt needs two vaccinations yellow fever and Chikungunya. Pt states needs a waiver form if  she can or can't take vaccinations due to her autoimmune disease. Will forward to Dr Epimenio Foot Pt expressed understanding and thanked me for calling

## 2022-08-28 NOTE — Telephone Encounter (Signed)
Tried to return pt's call. I LVM (ok per DPR) with office number advising patient we are wanting to know what her questions are regarding vaccines, which one she is asking about, etc. I asked for a call back.

## 2022-08-28 NOTE — Telephone Encounter (Signed)
Pt called back. Requesting a call back from nurse.  

## 2022-08-28 NOTE — Telephone Encounter (Signed)
Pt would like a call from the nurse to discuss taking vaccines to travel international.

## 2022-08-31 IMAGING — DX DG CHEST 2V
2 series · 2 of 2 positions shown · non-contrast
Comparison: 2492

CLINICAL DATA: Dive physical

EXAM:
CHEST - 2 VIEW

[dg chest 2 view (1 of 2)]
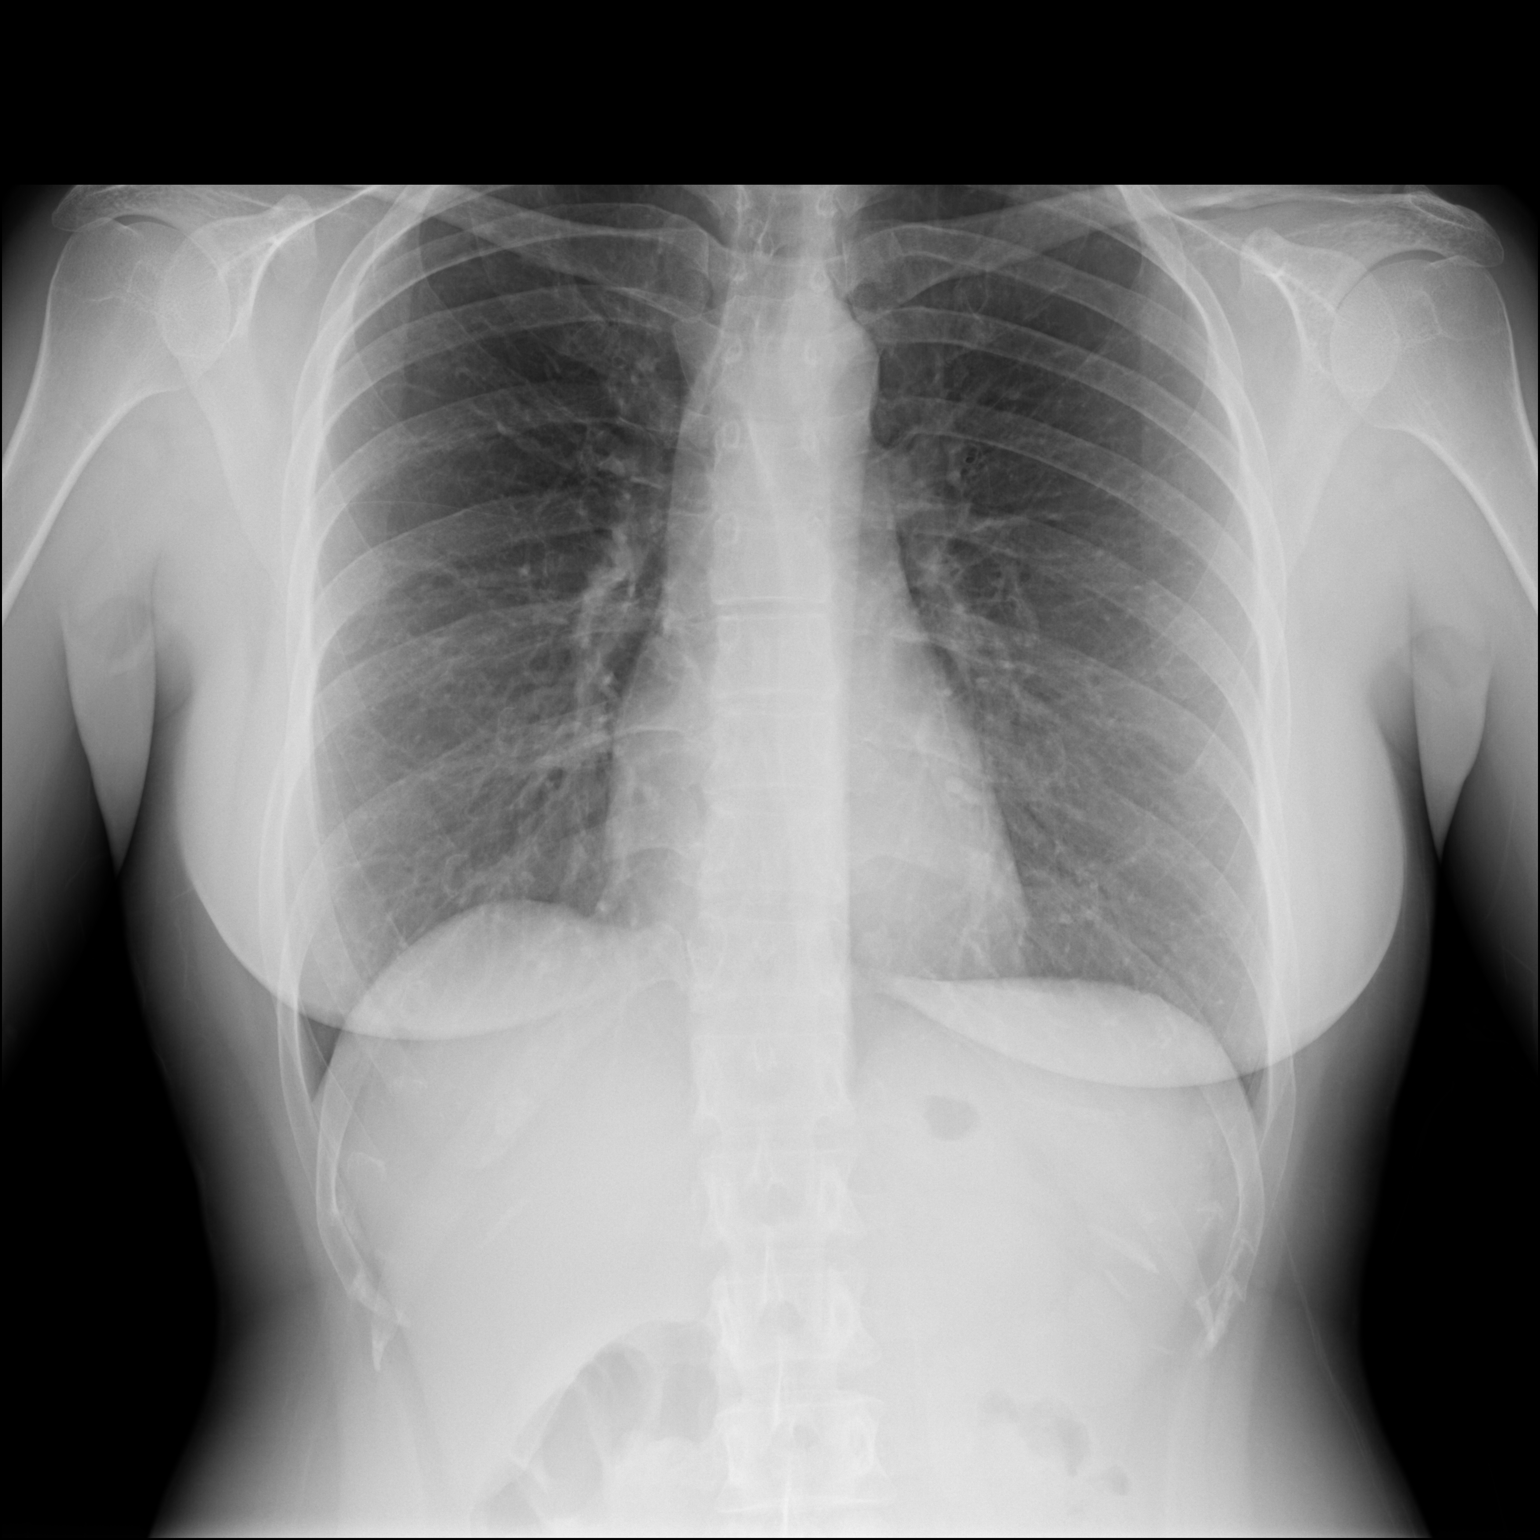

[dg chest 2 view (2 of 2)]
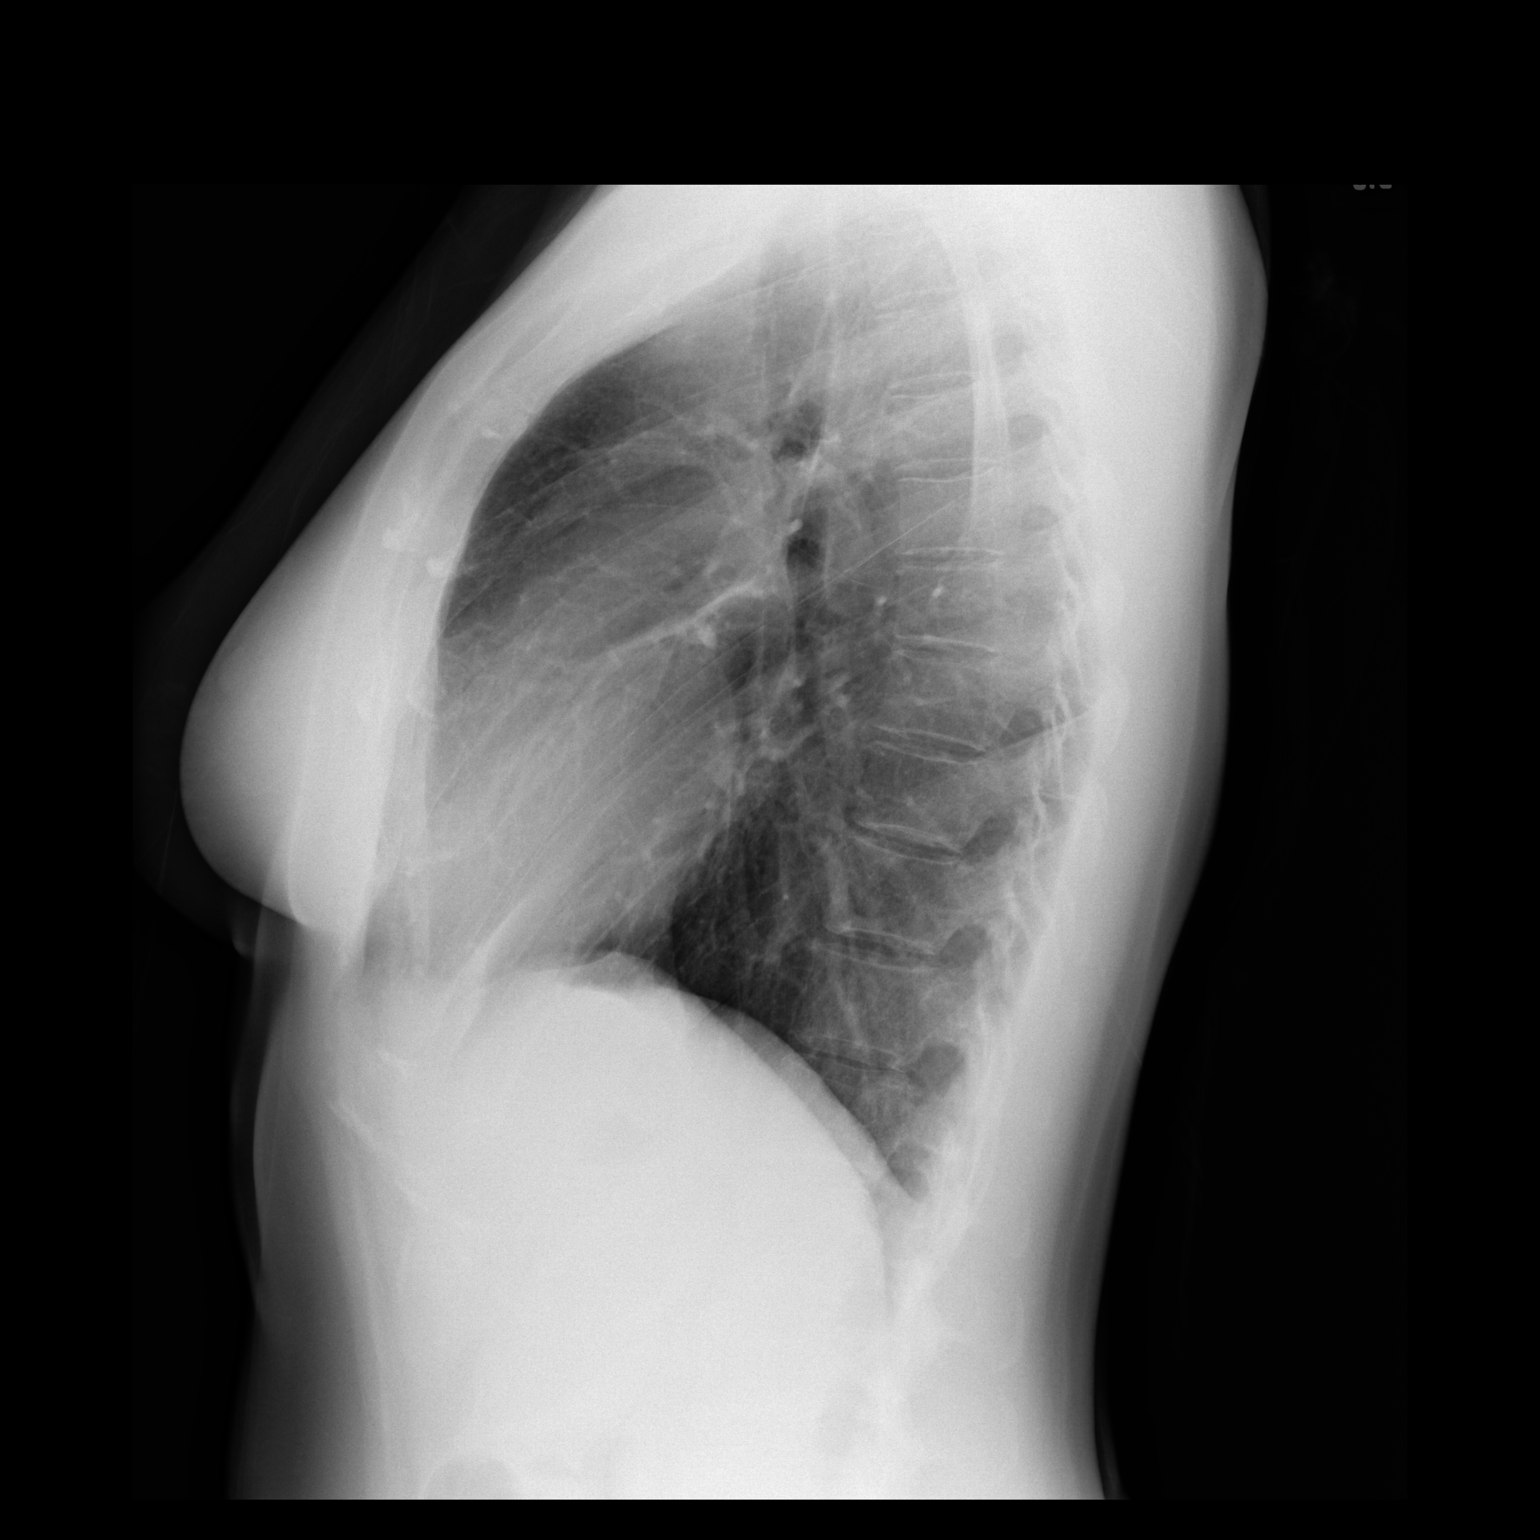

[2 of 2 positions shown; findings below may reference images not displayed]

FINDINGS: The heart size and mediastinal contours are within normal limits.
Both lungs are clear. No pleural effusion. The visualized skeletal
structures are unremarkable.
IMPRESSION: No acute process in the chest.

## 2022-10-11 NOTE — Progress Notes (Deleted)
Office Visit Note  Patient: Joyce [name]             Date of Birth: October 29, 1980           MRN: [id number]             PCP: Pcp, No Referring: No ref. provider found Visit Date: 10/24/2022   Subjective:  No chief complaint on file.   History of Present Illness: Joyce [name] is a 42 y.o. 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    Previous HPI 04/25/2022 Joyce [name] is a 42 y.o. 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  Overall symptoms are largely improved with the treatment still gets some eye pain and irritation particularly later in the day he is using Systane drops for this.  She has not lost any of the previous unintentional weight gain so far.  Has been noticing some difficulty with standing up from prolonged stationary position but no specific weakness or instability while walking.  Currently scheduled for next infusions May 1 and May 15.  Has also noticed a small rash spot on her upper abdomen not particularly painful or itchy.     Previous HPI 10/11/21 Joyce [name] is a 42 y.o. female here for follow up for sjogren syndrome. Since our last visit she saw Dr. 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 Since starting the medication she reports a great improvement in dry eyes and mouth symptoms, no longer requiring drops or constant stimulation of saliva. She has no new infections since starting, including when her son was ill with COVID at home. She has gained about 15 pounds unintentionally, previously was very stable at about 150 lbs with little variation over years except during pregnancy.   Previous HPI 03/13/2021 Joyce [name] is a 42 y.o.  female here for follow up sjogren's syndrome she started hydroxychloroquine after last visit but developed new skin rashes so stopped this medication.  Skin rash started about 3 weeks after starting the medicine and fully resolved within about 3 weeks after stopping.  Her biggest problem continues to be symptoms with her eyes which are dry and sometimes painful.  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  The pain improves once no longer staring at screens but quickly returns when looking towards the screens.  She uses the artificial tears type drops and has not noticed any other change or problem with visual acuity.  She also has mouth dryness this is less of a concern she drinks a lot of liquid and uses a mouth spray occasionally when needed. She reports her diarrhea resolves once starting the hydroxychloroquine and this has stayed improved despite discontinuing the medicine.  She reports some weight gain attributes this to eating out. She experiences a burning type of pain on the left thigh lasting about 2 weeks followed by about 1 week on the right upper arm the symptom has not recurred.  She is having some thigh pain and stiffness in the mornings bilaterally.     Previous HPI 01/03/21 Joyce [name] is a 42 y.o. female here for follow up for  sjogren's syndrome. She had interval visits with Dr. Sherryll Burger findings consistent with dry eyes but no evidence of neuritis or papilledema. She experienced episode of left lateral thigh pain lasting at least 2 weeks with burning sensation and worse with walking and sensation such as pants rubbing against skin. She also experienced an episode of pain on the back of the neck that then spontaneously resolved. She has noticed some increased hair coming out with washing and brushing although no focal thin or bald spots noticed. She has no visible skin changes on any of the affected areas. She  continues to notice salivary gland swelling intermittently. She also reports persistent symptom of diarrhea that is ongoing for about 8 months having around 3 loose bowel movements daily. She is taking imodium daily for these symptoms. She denies any blood or mucus in stools. She has a family history of colonic polyps but no malignancy or inflammatory bowel disease.   Previous HPI 10/12/20 Joyce [name] is a 42 y.o. female here for evaluation of positive autoimmune serology checked in association with optic neuritis and mild asymmetric lacrimal gland size.  Symptoms started around July with right eye pain particularly with movement after symptoms persisted for 10 days he went for evaluation.  We will send to her work-up including eye exam demonstrating swelling around the optic nerve and MRI that was largely unremarkable possible small lacrimal gland enlargement.  She did not start any particular treatments or intervention for this symptoms did improve.  She started having symptoms on the left side last week and went for eye exam on Friday felt the findings were consistent with those on the right but at earlier stage. Besides this she did have an episode of right cheek and jaw pain a week ago improved spontaneously without treatment.  She has noticed new headaches typically start posteriorly and are exacerbated with forward bending leading to a bilateral frontal pounding type of pain.  She denies any sensation of dry eyes or itching does sometimes feel a dry mouth.   Labs reviewed 2022 ANA pos SSA >8.0 SSB >8.0 RF 66 IgG 3800 Complement C4 12   No Rheumatology ROS completed.   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   Activities involving scuba diving 06/14/2017    Past Medical History:  Diagnosis Date   Myelin oligodendrocyte glycoprotein antibody disorder (MOGAD)    Optic neuritis    Sjogren's syndrome (HCC)     Family History  Problem Relation Age of Onset   Diabetes Mother    Colon polyps Mother    Hyperlipidemia Father    High blood pressure Father    Breast cancer Paternal Grandmother        Post menopause   Heart attack Paternal Grandfather    Past Surgical History:  Procedure Laterality Date   DILATION AND CURETTAGE OF UTERUS  2011   Social History   Social History Narrative   Not on file   Immunization  History  Administered Date(s) Administered   Influenza,inj,Quad PF,6+ Mos 09/15/2018, 09/15/2018   Tdap 11/05/2016     Objective: Vital Signs: There were no vitals taken for this visit.   Physical Exam   Musculoskeletal Exam: ***  CDAI Exam: CDAI Score: -- Patient Global: --; Provider Global: -- Swollen: --; Tender: -- Joint Exam 10/24/2022   No joint exam has been documented for this visit   There is currently no information documented on the homunculus. Go to the Rheumatology activity and complete the homunculus joint exam.  Investigation: No additional findings.  Imaging: No results found.  Recent Labs: Lab Results  Component Value Date   WBC 7.3 07/04/2022   HGB 12.1 07/04/2022   PLT 261 07/04/2022   NA 138 07/13/2022   K 4.8 07/13/2022   CL 106 07/13/2022   CO2 25 07/13/2022   GLUCOSE 79 07/13/2022   BUN 16 07/13/2022   CREATININE 0.66 07/13/2022   BILITOT 0.3 07/13/2022   ALKPHOS 74 06/20/2022   AST 21 07/13/2022   ALT 19 07/13/2022   PROT 7.7  07/13/2022   ALBUMIN 4.4 06/20/2022   CALCIUM 9.5 07/13/2022   GFRAA 98 11/17/2018   QFTBGOLDPLUS NEGATIVE 07/04/2022    Speciality Comments: No specialty comments available.  Procedures:  No procedures performed Allergies: Plaquenil [hydroxychloroquine]   Assessment / Plan:     Visit Diagnoses: No diagnosis found.  ***  Orders: No orders of the defined types were placed in this encounter.  No orders of the defined types were placed in this encounter.    Follow-Up Instructions: No follow-ups on file.   Metta Clines, RT  Note - This record has been created using [employer].  Chart creation errors have been sought, but may not always  have been located. Such creation errors do not reflect on  the standard of medical care.

## 2022-10-24 ENCOUNTER — Ambulatory Visit: Payer: Managed Care, Other (non HMO) | Admitting: Internal Medicine

## 2022-10-24 DIAGNOSIS — G3781 Myelin oligodendrocyte glycoprotein antibody disease: Secondary | ICD-10-CM

## 2022-10-24 DIAGNOSIS — M3500 Sicca syndrome, unspecified: Secondary | ICD-10-CM

## 2022-10-24 DIAGNOSIS — Z79899 Other long term (current) drug therapy: Secondary | ICD-10-CM

## 2022-10-31 NOTE — Progress Notes (Deleted)
Office Visit Note  Patient: Joyce [name]             Date of Birth: May 14, 1980           MRN: [id number]             PCP: Pcp, No Referring: No ref. provider found Visit Date: 11/12/2022   Subjective:  No chief complaint on file.   History of Present Illness: Joyce [name] is a 42 y.o. 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    Previous HPI 04/25/2022 Joyce [name] is a 42 y.o. 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  Overall symptoms are largely improved with the treatment still gets some eye pain and irritation particularly later in the day he is using Systane drops for this.  She has not lost any of the previous unintentional weight gain so far.  Has been noticing some difficulty with standing up from prolonged stationary position but no specific weakness or instability while walking.  Currently scheduled for next infusions May 1 and May 15.  Has also noticed a small rash spot on her upper abdomen not particularly painful or itchy.     Previous HPI 10/11/21 Joyce [name] is a 42 y.o. female here for follow up for sjogren syndrome. Since our last visit she saw Dr. 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 Since starting the medication she reports a great improvement in dry eyes and mouth symptoms, no longer requiring drops or constant stimulation of saliva. She has no new infections since starting, including when her son was ill with COVID at home. She has gained about 15 pounds unintentionally, previously was very stable at about 150 lbs with little variation over years except during pregnancy.   Previous HPI 03/13/2021 Joyce [name] is a 42 y.o.  female here for follow up sjogren's syndrome she started hydroxychloroquine after last visit but developed new skin rashes so stopped this medication.  Skin rash started about 3 weeks after starting the medicine and fully resolved within about 3 weeks after stopping.  Her biggest problem continues to be symptoms with her eyes which are dry and sometimes painful.  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  The pain improves once no longer staring at screens but quickly returns when looking towards the screens.  She uses the artificial tears type drops and has not noticed any other change or problem with visual acuity.  She also has mouth dryness this is less of a concern she drinks a lot of liquid and uses a mouth spray occasionally when needed. She reports her diarrhea resolves once starting the hydroxychloroquine and this has stayed improved despite discontinuing the medicine.  She reports some weight gain attributes this to eating out. She experiences a burning type of pain on the left thigh lasting about 2 weeks followed by about 1 week on the right upper arm the symptom has not recurred.  She is having some thigh pain and stiffness in the mornings bilaterally.     Previous HPI 01/03/21 Olivea [name] is a 42 y.o. female here for follow up for  sjogren's syndrome. She had interval visits with Dr. Sherryll Burger findings consistent with dry eyes but no evidence of neuritis or papilledema. She experienced episode of left lateral thigh pain lasting at least 2 weeks with burning sensation and worse with walking and sensation such as pants rubbing against skin. She also experienced an episode of pain on the back of the neck that then spontaneously resolved. She has noticed some increased hair coming out with washing and brushing although no focal thin or bald spots noticed. She has no visible skin changes on any of the affected areas. She  continues to notice salivary gland swelling intermittently. She also reports persistent symptom of diarrhea that is ongoing for about 8 months having around 3 loose bowel movements daily. She is taking imodium daily for these symptoms. She denies any blood or mucus in stools. She has a family history of colonic polyps but no malignancy or inflammatory bowel disease.   Previous HPI 10/12/20 Joyce [name] is a 42 y.o. female here for evaluation of positive autoimmune serology checked in association with optic neuritis and mild asymmetric lacrimal gland size.  Symptoms started around July with right eye pain particularly with movement after symptoms persisted for 10 days he went for evaluation.  We will send to her work-up including eye exam demonstrating swelling around the optic nerve and MRI that was largely unremarkable possible small lacrimal gland enlargement.  She did not start any particular treatments or intervention for this symptoms did improve.  She started having symptoms on the left side last week and went for eye exam on Friday felt the findings were consistent with those on the right but at earlier stage. Besides this she did have an episode of right cheek and jaw pain a week ago improved spontaneously without treatment.  She has noticed new headaches typically start posteriorly and are exacerbated with forward bending leading to a bilateral frontal pounding type of pain.  She denies any sensation of dry eyes or itching does sometimes feel a dry mouth.   Labs reviewed 2022 ANA pos SSA >8.0 SSB >8.0 RF 66 IgG 3800 Complement C4 12   No Rheumatology ROS completed.   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   Activities involving scuba diving 06/14/2017    Past Medical History:  Diagnosis Date   Myelin oligodendrocyte glycoprotein antibody disorder (MOGAD)    Optic neuritis    Sjogren's syndrome (HCC)     Family History  Problem Relation Age of Onset   Diabetes Mother    Colon polyps Mother    Hyperlipidemia Father    High blood pressure Father    Breast cancer Paternal Grandmother        Post menopause   Heart attack Paternal Grandfather    Past Surgical History:  Procedure Laterality Date   DILATION AND CURETTAGE OF UTERUS  2011   Social History   Social History Narrative   Not on file   Immunization  History  Administered Date(s) Administered   Influenza,inj,Quad PF,6+ Mos 09/15/2018, 09/15/2018   Tdap 11/05/2016     Objective: Vital Signs: There were no vitals taken for this visit.   Physical Exam   Musculoskeletal Exam: ***  CDAI Exam: CDAI Score: -- Patient Global: --; Provider Global: -- Swollen: --; Tender: -- Joint Exam 11/12/2022   No joint exam has been documented for this visit   There is currently no information documented on the homunculus. Go to the Rheumatology activity and complete the homunculus joint exam.  Investigation: No additional findings.  Imaging: No results found.  Recent Labs: Lab Results  Component Value Date   WBC 7.3 07/04/2022   HGB 12.1 07/04/2022   PLT 261 07/04/2022   NA 138 07/13/2022   K 4.8 07/13/2022   CL 106 07/13/2022   CO2 25 07/13/2022   GLUCOSE 79 07/13/2022   BUN 16 07/13/2022   CREATININE 0.66 07/13/2022   BILITOT 0.3 07/13/2022   ALKPHOS 74 06/20/2022   AST 21 07/13/2022   ALT 19 07/13/2022   PROT 7.7  07/13/2022   ALBUMIN 4.4 06/20/2022   CALCIUM 9.5 07/13/2022   GFRAA 98 11/17/2018   QFTBGOLDPLUS NEGATIVE 07/04/2022    Speciality Comments: No specialty comments available.  Procedures:  No procedures performed Allergies: Plaquenil [hydroxychloroquine]   Assessment / Plan:     Visit Diagnoses: No diagnosis found.  ***  Orders: No orders of the defined types were placed in this encounter.  No orders of the defined types were placed in this encounter.    Follow-Up Instructions: No follow-ups on file.   Metta Clines, RT  Note - This record has been created using [employer].  Chart creation errors have been sought, but may not always  have been located. Such creation errors do not reflect on  the standard of medical care.

## 2022-11-12 ENCOUNTER — Ambulatory Visit: Payer: Managed Care, Other (non HMO) | Admitting: Internal Medicine

## 2022-11-12 DIAGNOSIS — M3500 Sicca syndrome, unspecified: Secondary | ICD-10-CM

## 2022-11-12 DIAGNOSIS — G3781 Myelin oligodendrocyte glycoprotein antibody disease: Secondary | ICD-10-CM

## 2022-12-04 NOTE — Progress Notes (Signed)
Office Visit Note  Patient: Joyce [name]             Date of Birth: 31-May-1980           MRN: [id number]             PCP: Pcp, No Referring: No ref. provider found Visit Date: 12/18/2022   Subjective:  Follow-up   History of Present Illness: Joyce [name] is a 42 y.o. 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  Overall symptoms have been doing well with no serious eye irritation or visual changes.  She does report noticing some difference in dryness or mild gland swelling that is more improved shortly following infusion versus longer recommended treatment. She is planning on [place] trip including preventative vaccinations.  Previous HPI 04/25/2022 Joyce [name] is a 42 y.o. 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  Overall symptoms are largely improved with the treatment still gets some eye pain and irritation particularly later in the day he is using Systane drops for this.  She has not lost any of the previous unintentional weight gain so far.  Has been noticing some difficulty with standing up from prolonged stationary position but no specific weakness or instability while walking.  Currently scheduled for next infusions May 1 and May 15.  Has also noticed a small rash spot on her upper abdomen not particularly painful or itchy.     Previous HPI 10/11/21 Joyce [name] is a 42 y.o. female here for follow up for sjogren syndrome. Since our last visit she saw Dr. 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 Since starting the medication she reports a great improvement in dry eyes and mouth symptoms, no longer requiring drops or constant stimulation of  saliva. She has no new infections since starting, including when her son was ill with COVID at home. She has gained about 15 pounds unintentionally, previously was very stable at about 150 lbs with little variation over years except during pregnancy.   Previous HPI 03/13/2021 Joyce [name] is a 42 y.o. female here for follow up sjogren's syndrome she started hydroxychloroquine after last visit but developed new skin rashes so stopped this medication.  Skin rash started about 3 weeks after starting the medicine and fully resolved within about 3 weeks after stopping.  Her biggest problem continues to be symptoms with her eyes which are dry and sometimes painful.  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  The pain improves once no longer staring at screens but quickly returns when looking towards the screens.  She uses the artificial tears type drops and has not noticed any other change or problem with visual acuity.  She also has mouth dryness this is less of a concern she drinks a lot of liquid and uses a mouth spray occasionally when needed. She reports her diarrhea resolves once starting the hydroxychloroquine and this has stayed improved despite discontinuing the medicine.  She reports some weight gain attributes this to eating out. She experiences a burning type of pain on the left thigh lasting about 2 weeks followed by about 1 week  on the right upper arm the symptom has not recurred.  She is having some thigh pain and stiffness in the mornings bilaterally.     Previous HPI 01/03/21 Joyce [name] is a 42 y.o. female here for follow up for sjogren's syndrome. She had interval visits with Dr. Sherryll Burger findings consistent with dry eyes but no evidence of neuritis or papilledema. She experienced episode of left lateral thigh pain lasting at least 2 weeks with burning sensation and worse with walking and sensation such as pants  rubbing against skin. She also experienced an episode of pain on the back of the neck that then spontaneously resolved. She has noticed some increased hair coming out with washing and brushing although no focal thin or bald spots noticed. She has no visible skin changes on any of the affected areas. She continues to notice salivary gland swelling intermittently. She also reports persistent symptom of diarrhea that is ongoing for about 8 months having around 3 loose bowel movements daily. She is taking imodium daily for these symptoms. She denies any blood or mucus in stools. She has a family history of colonic polyps but no malignancy or inflammatory bowel disease.   Previous HPI 10/12/20 Joyce [name] is a 42 y.o. female here for evaluation of positive autoimmune serology checked in association with optic neuritis and mild asymmetric lacrimal gland size.  Symptoms started around July with right eye pain particularly with movement after symptoms persisted for 10 days he went for evaluation.  We will send to her work-up including eye exam demonstrating swelling around the optic nerve and MRI that was largely unremarkable possible small lacrimal gland enlargement.  She did not start any particular treatments or intervention for this symptoms did improve.  She started having symptoms on the left side last week and went for eye exam on Friday felt the findings were consistent with those on the right but at earlier stage. Besides this she did have an episode of right cheek and jaw pain a week ago improved spontaneously without treatment.  She has noticed new headaches typically start posteriorly and are exacerbated with forward bending leading to a bilateral frontal pounding type of pain.  She denies any sensation of dry eyes or itching does sometimes feel a dry mouth.   Labs reviewed 2022 ANA pos SSA >8.0 SSB >8.0 RF 66 IgG 3800 Complement C4 12   Review of Systems  Constitutional:  Positive for fatigue.   HENT: Negative.  Negative for mouth sores and mouth dryness.   Eyes:  Positive for dryness.  Respiratory: Negative.  Negative for shortness of breath.   Cardiovascular: Negative.  Negative for chest pain and palpitations.  Gastrointestinal: Negative.  Negative for blood in stool, constipation and diarrhea.  Endocrine: Negative.  Negative for increased urination.  Genitourinary:  Negative for involuntary urination.  Musculoskeletal:  Negative for joint pain, gait problem, joint pain, joint swelling, myalgias, muscle weakness, morning stiffness, muscle tenderness and myalgias.  Skin: Negative.  Negative for color change, rash, hair loss and sensitivity to sunlight.  Allergic/Immunologic: Negative.  Negative for susceptible to infections.  Neurological:  Negative for dizziness and headaches.  Hematological:  Positive for swollen glands.  Psychiatric/Behavioral: Negative.  Negative for depressed mood and sleep disturbance. The patient is not nervous/anxious.     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/2020   Activities involving scuba diving 06/14/2017    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)    Optic neuritis    Sjogren's syndrome (HCC)     Family History  Problem Relation Age of Onset   Diabetes Mother    Colon polyps Mother    Hyperlipidemia  Father    High blood pressure Father    Breast cancer Paternal Grandmother        Post menopause   Heart attack Paternal Grandfather    Past Surgical History:  Procedure Laterality Date   DILATION AND CURETTAGE OF UTERUS  2011   Social History   Social History Narrative   Not on file   Immunization History  Administered Date(s) Administered   Influenza,inj,Quad PF,6+ Mos 09/15/2018, 09/15/2018   Tdap 11/05/2016     Objective: Vital Signs: BP 107/74 (BP Location: Left Arm, Patient Position: Sitting, Cuff Size: Normal)   Pulse 75   Resp 16   Ht 5\' 6"  (1.676 m)   Wt 168 lb (76.2 kg)   LMP  (LMP Unknown) Comment: WNL  BMI 27.12 kg/m    Physical Exam Eyes:     Conjunctiva/sclera: Conjunctivae normal.  Cardiovascular:     Rate and Rhythm: Normal rate and regular rhythm.  Pulmonary:     Effort: Pulmonary effort is normal.     Breath sounds: Normal breath sounds.  Lymphadenopathy:     Cervical: No cervical adenopathy.  Skin:    General: Skin is warm and dry.  Neurological:     Mental Status: She is alert.  Psychiatric:        Mood and Affect: Mood normal.      Musculoskeletal Exam:  Shoulders full ROM no tenderness or swelling Elbows full ROM no tenderness or swelling Wrists full ROM no tenderness or swelling Fingers full ROM no tenderness or swelling Knees full ROM no tenderness or swelling   Investigation: No additional findings.  Imaging: No results found.  Recent Labs: Lab Results  Component Value Date   WBC 4.4 12/05/2022   HGB 11.9 12/05/2022   PLT 235 12/05/2022   NA 138 07/13/2022   K 4.8 07/13/2022   CL 106 07/13/2022   CO2 25 07/13/2022   GLUCOSE 79 07/13/2022   BUN 16 07/13/2022   CREATININE 0.66 07/13/2022   BILITOT 0.3 07/13/2022   ALKPHOS 74 06/20/2022   AST 21 07/13/2022   ALT 19 07/13/2022   PROT 7.7 07/13/2022   ALBUMIN 4.4 06/20/2022   CALCIUM 9.5 07/13/2022   GFRAA 98 11/17/2018   QFTBGOLDPLUS NEGATIVE 07/04/2022     Speciality Comments: No specialty comments available.  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  The previous weakness type symptoms.  Better without any change in intervention.  Currently without any major salivary gland or lymph node swelling.  Plan to continue rituximab infusion every 6 months would  not recommend any tapering still with noticeable variance in symptoms between dosing.  High risk medication use  Tolerating infusion without any noticeable problem.  Repeat metabolic panel after last infusion did not show any persistent abnormality in liver enzymes.  Recent repeated blood count and immunoglobulins shows continued gradual improvement in elevated serum IgG level.  No serious interval infections.  Discussed vaccinations with anticipated trip to [place] may have diminished effectiveness on rituximab treatment. If possible could aim to complete any needed vaccines about 1 month prior to rituximab dosing schedule.  Orders: No orders of the defined types were placed in this encounter.  No orders of the defined types were placed in this encounter.    Follow-Up Instructions: Return in about 6 months (around 06/17/2023) for pSS/MOGAD on RTX f/u 6mos.   Fuller Plan, MD  Note - This record has been created using [employer].  Chart creation errors have been sought, but may not always  have been located. Such creation errors do not reflect on  the standard of medical care.

## 2022-12-05 ENCOUNTER — Other Ambulatory Visit: Payer: Self-pay | Admitting: *Deleted

## 2022-12-05 ENCOUNTER — Other Ambulatory Visit: Payer: Self-pay

## 2022-12-05 DIAGNOSIS — Z79899 Other long term (current) drug therapy: Secondary | ICD-10-CM

## 2022-12-05 DIAGNOSIS — M3500 Sicca syndrome, unspecified: Secondary | ICD-10-CM

## 2022-12-05 DIAGNOSIS — G3781 Myelin oligodendrocyte glycoprotein antibody disease: Secondary | ICD-10-CM

## 2022-12-06 LAB — CBC WITH DIFFERENTIAL/PLATELET
Basophils Absolute: 0 10*3/uL (ref 0.0–0.2)
Basos: 1 %
EOS (ABSOLUTE): 0.1 10*3/uL (ref 0.0–0.4)
Eos: 3 %
Hematocrit: 37.5 % (ref 34.0–46.6)
Hemoglobin: 11.9 g/dL (ref 11.1–15.9)
Immature Grans (Abs): 0 10*3/uL (ref 0.0–0.1)
Immature Granulocytes: 0 %
Lymphocytes Absolute: 1 10*3/uL (ref 0.7–3.1)
Lymphs: 22 %
MCH: 27.9 pg (ref 26.6–33.0)
MCHC: 31.7 g/dL (ref 31.5–35.7)
MCV: 88 fL (ref 79–97)
Monocytes Absolute: 0.8 10*3/uL (ref 0.1–0.9)
Monocytes: 17 %
Neutrophils Absolute: 2.5 10*3/uL (ref 1.4–7.0)
Neutrophils: 57 %
Platelets: 235 10*3/uL (ref 150–450)
RBC: 4.27 x10E6/uL (ref 3.77–5.28)
RDW: 12.4 % (ref 11.7–15.4)
WBC: 4.4 10*3/uL (ref 3.4–10.8)

## 2022-12-06 LAB — IGG, IGA, IGM
IgA/Immunoglobulin A, Serum: 111 mg/dL (ref 87–352)
IgG (Immunoglobin G), Serum: 1782 mg/dL — ABNORMAL HIGH (ref 586–1602)
IgM (Immunoglobulin M), Srm: 116 mg/dL (ref 26–217)

## 2022-12-18 ENCOUNTER — Encounter: Payer: Self-pay | Admitting: Internal Medicine

## 2022-12-18 ENCOUNTER — Ambulatory Visit: Payer: Managed Care, Other (non HMO) | Attending: Internal Medicine | Admitting: Internal Medicine

## 2022-12-18 VITALS — BP 107/74 | HR 75 | Resp 16 | Ht 66.0 in | Wt 168.0 lb

## 2022-12-18 DIAGNOSIS — G3781 Myelin oligodendrocyte glycoprotein antibody disease: Secondary | ICD-10-CM | POA: Diagnosis not present

## 2022-12-18 DIAGNOSIS — M3500 Sicca syndrome, unspecified: Secondary | ICD-10-CM | POA: Diagnosis not present

## 2022-12-18 DIAGNOSIS — H469 Unspecified optic neuritis: Secondary | ICD-10-CM | POA: Diagnosis not present

## 2023-05-22 ENCOUNTER — Other Ambulatory Visit: Payer: Self-pay | Admitting: *Deleted

## 2023-05-22 ENCOUNTER — Other Ambulatory Visit: Payer: Self-pay

## 2023-05-22 DIAGNOSIS — G3781 Myelin oligodendrocyte glycoprotein antibody disease: Secondary | ICD-10-CM

## 2023-05-23 LAB — CBC WITH DIFFERENTIAL/PLATELET
Basophils Absolute: 0 10*3/uL (ref 0.0–0.2)
Basos: 1 %
EOS (ABSOLUTE): 0.1 10*3/uL (ref 0.0–0.4)
Eos: 3 %
Hematocrit: 37.7 % (ref 34.0–46.6)
Hemoglobin: 12.5 g/dL (ref 11.1–15.9)
Immature Grans (Abs): 0 10*3/uL (ref 0.0–0.1)
Immature Granulocytes: 0 %
Lymphocytes Absolute: 0.7 10*3/uL (ref 0.7–3.1)
Lymphs: 16 %
MCH: 28.2 pg (ref 26.6–33.0)
MCHC: 33.2 g/dL (ref 31.5–35.7)
MCV: 85 fL (ref 79–97)
Monocytes Absolute: 0.6 10*3/uL (ref 0.1–0.9)
Monocytes: 13 %
Neutrophils Absolute: 2.9 10*3/uL (ref 1.4–7.0)
Neutrophils: 67 %
Platelets: 249 10*3/uL (ref 150–450)
RBC: 4.44 x10E6/uL (ref 3.77–5.28)
RDW: 12.3 % (ref 11.7–15.4)
WBC: 4.4 10*3/uL (ref 3.4–10.8)

## 2023-06-06 ENCOUNTER — Ambulatory Visit (INDEPENDENT_AMBULATORY_CARE_PROVIDER_SITE_OTHER): Payer: Managed Care, Other (non HMO) | Admitting: Neurology

## 2023-06-06 ENCOUNTER — Encounter: Payer: Self-pay | Admitting: Neurology

## 2023-06-06 VITALS — BP 103/65 | HR 71 | Ht 67.0 in | Wt 171.0 lb

## 2023-06-06 DIAGNOSIS — Z79899 Other long term (current) drug therapy: Secondary | ICD-10-CM

## 2023-06-06 DIAGNOSIS — G3781 Myelin oligodendrocyte glycoprotein antibody disease: Secondary | ICD-10-CM | POA: Diagnosis not present

## 2023-06-06 DIAGNOSIS — H469 Unspecified optic neuritis: Secondary | ICD-10-CM

## 2023-06-06 NOTE — Progress Notes (Signed)
 [facility]  PATIENT: Joyce [name] DOB: [birth year]  REFERRING DOCTOR OR PCP: Joyce [name], OD; Joyce [name] (PCP) SOURCE: Patient, notes from optometry.  _________________________________   HISTORICAL  CHIEF COMPLAINT:  Chief Complaint  Patient presents with   Follow-up    Pt in 10 alone Pt here for MOGAD f/u Pt states infusion is not lasting long Pt states has symptoms 4 months after infusion     HISTORY OF PRESENT ILLNESS:  Joyce [name] is a 43 y.o. woman with eye pain and possible optic neuritis.  UPDATE 06/06/2023: She is on Rituxan but notes a 'crap gap' starting about 4 months more for the Sjogren's than the MOGAD - noting dry eyes and parotid pain.    She just did a 2 week trip to the [place].   She used the scopalamine patch due to seasickness and vision in both eyes was worse.      She feels strength is about the same as last visit.  Gait is fine.    Vision is blurry, but much better when she wears her glasses.  Color vision is symmetric.    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  She denies new visual issues.   She saw ophthalmology and ws told her eye exam was unchanged compared to last visit.  She is noting a lot of leg cramps, right > left.    She notes the muscles stiffens up and takes a couple minutes to relax.   These occur randomly, day > night.   She had her first Rituxan May 2023 most recent one earlier this week.  Currently 1000 mg x 2 every 6 months.  .  In 2024, we checked anti-CD20 and CD19 and they were both very low as expected).  At last check, IgG is mildly elevated and IgM is normal.  She tolerates Rituxan well and felt her Sjogren's syndrome symptoms  (especially dry mouth  but also eyes) improved afterwards.      The episodes of left and right optic neuritis occurred at different times.  She has not had a spinal cord syndrome.  She denies any history of numbness, clumsiness,  weakness or visual change consistent with demyelination.  She has no neuro-imaging studies.      She had positive ANA and ENA showed SSA and SSB antibodies.   RA also elevated.   She saw rheumatology (Dr. Rodell [name]) and diagnosed with Sjogren's.  Plaquenil  was started but she had a rash and it was discontinued.     She is anti-MOG positive (initial titer was 1: 320 and follow-up titer was down to 1: 10).   Repeat visit to ophthalmology, Dr. 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  Visual History: On 08/04/2020, she had the onset of eye pain .  Pain peaked about a week later and then began to improve.   Movement of the eye worsened the pain.    By 08/15/2020, pain was only occurring on far lateral gaze.  That day, she saw Dr. Shellye [name].  The dilated eye exam showed a slightly elevated/swollen right optic nerve.  This was confirmed with OCT testing.  At no time did she notice any change in visual acuity, visual field or color vision..      She is otherwise healthy and takes no medications except for multivitamins.    She  has no personal history of an autoimmune disorder.  No family history of MS or other autoimmune disorders.  She had a Covid-19 booster (Pfizer) abput 3 days before the onset of symptoms.     IMAGING: MRI brain 09/03/2020 showed  This is a normal MRI of the brain with and without contrast.  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   MRI orbits 09/03/2020 showed: 1.   No acute findings.  Normal enhancement. 2.   Optic nerves appear normal.   3.   Mild asymmetry of the lacrimal glands, larger on the right.  This could be incidental as the dimensions of the glands are within normal limits.  They have normal signal.  LABS: 08/13/2020:  ANA positive with ENA showing increased SSA and SSB 09/11/2021:  decreased C4 12 (15-57), normal C3,  elevated RF=66, increased IgG=3800 (normal IgM and IgA) , elevated IgG4 97.9 (4-86) 11/15/2020: SPEP with increased gamma globulin but no M spike; RA titer = 160 (<10 normal); HIV negative; RPR negative; Lyme negative, Bartonella negative, anti-CCP negative; HLA,   NMO IgG negative, TSH nornmal, Vit D normal, ESR/CRP normal  REVIEW OF SYSTEMS: Constitutional: No fevers, chills, sweats, or change in appetite Eyes: No visual changes, double vision, eye pain Ear, nose and throat: No hearing loss, ear pain, nasal congestion, sore throat Cardiovascular: No chest pain, palpitations Respiratory:  No shortness of breath at rest or with exertion.   No wheezes GastrointestinaI: No nausea, vomiting, diarrhea, abdominal pain, fecal incontinence Genitourinary:  No dysuria, urinary retention or frequency.  No nocturia. Musculoskeletal:  No neck pain, back pain Integumentary: No rash, pruritus, skin lesions Neurological: as above Psychiatric: No depression at this time.  No anxiety Endocrine: No palpitations, diaphoresis, change in appetite, change in weigh or increased thirst Hematologic/Lymphatic:  No anemia, purpura, petechiae. 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)    Optic neuritis    Sjogren's syndrome (HCC)     PAST SURGICAL HISTORY: Past Surgical History:  Procedure Laterality Date   DILATION AND CURETTAGE OF UTERUS  2011     FAMILY HISTORY: Family History  Problem Relation Age of Onset   Diabetes Mother    Colon polyps Mother    Hyperlipidemia Father    High blood pressure Father    Breast cancer Paternal Grandmother        Post menopause   Heart attack Paternal Grandfather     SOCIAL HISTORY:  Social History   Socioeconomic History   Marital status: Married    Spouse name: Not on file   Number of children: 2   Years of education: Not on file   Highest education level: Not on file  Occupational History   Not on file  Tobacco Use   Smoking status: Never    Passive exposure: Never   Smokeless tobacco: Never  Vaping Use   Vaping status: Never Used  Substance and  Sexual Activity   Alcohol use: Yes    Comment: occasionally    Drug use: Never   Sexual activity: Yes    Birth control/protection: Condom, I.U.D.    Comment: Paragard  IUD inserted 11/27/18  Other Topics Concern   Not on file  Social History Narrative   Pt lives with family    Pt works    Social Drivers of Corporate investment banker Strain: Not on file  Food Insecurity: No Food Insecurity (12/07/2020)   Received from [employer], [facility]   Hunger Vital Sign    Worried About Running Out of Food in the Last Year: Never true    Ran Out of Food in the Last Year: Never true  Transportation Needs: Not on file  Physical Activity: Not on file  Stress: Not on file  Social Connections: Unknown (06/20/2021)   [facility] from [facility], [facility]   Social Network    Social Network: Not on file  Intimate Partner Violence: Unknown (05/12/2021)   Received from [facility], [facility]   HITS    Physically Hurt: Not on file    Insult or Talk Down To: Not on file    Threaten Physical Harm: Not on file    Scream or Curse: Not on file     PHYSICAL EXAM  Vitals:   06/06/23 1255  BP: 103/65  Pulse: 71  Weight: 171 lb (77.6 kg)  Height: 5\' 7"  (1.702 m)    Body mass index is 26.78 kg/m.  Visual  acuity: OD 20/25 OS 20/20 Symmetric color vision  General: The patient is well-developed and well-nourished and in no acute distress  HEENT:  Head is [place]/AT.  Sclera are anicteric.     Skin: Extremities are without rash or  edema.  Musculoskeletal:  Back is nontender  Neurologic Exam  Mental status: The patient is alert and oriented x 3 at the time of the examination. The patient has apparent normal recent and remote memory, with an apparently normal attention span and concentration ability.   Speech is normal.  Cranial nerves: Extraocular movements are full.  She had a mild afferent pupillary defect on the right.  Color vision was symmetric there is good facial sensation to soft touch bilaterally.Facial strength is normal.  Trapezius and sternocleidomastoid strength is normal. No dysarthria is noted.  No obvious hearing deficits are noted.  Motor:  Muscle bulk is normal.   Tone is normal. Strength appears to be 5 / 5 in all 4 extremities.  She is able to rise up from a chair without using her arms at least 3 times.  Sensory: Sensory testing is intact to pinprick, soft touch and vibration sensation in all 4 extremities.  Coordination: Cerebellar testing reveals good finger-nose-finger and heel-to-shin bilaterally.  Gait and station: Station is normal.   Gait is normal. Tandem gait is normal. Romberg is negative.   Reflexes: Deep tendon reflexes are symmetric and normal bilaterally.      DIAGNOSTIC DATA (LABS, IMAGING, TESTING) - I reviewed patient records, labs, notes, testing and imaging myself where available.  Lab Results  Component Value Date   WBC 4.4 05/22/2023   HGB 12.5 05/22/2023   HCT 37.7 05/22/2023   MCV 85 05/22/2023   PLT 249 05/22/2023      Component Value Date/Time   NA 138 07/13/2022 1015   NA 138 06/20/2022 1652   K 4.8 07/13/2022 1015   CL 106 07/13/2022 1015   CO2 25 07/13/2022 1015  GLUCOSE 79 07/13/2022 1015   BUN 16 07/13/2022 1015   BUN 17  06/20/2022 1652   CREATININE 0.66 07/13/2022 1015   CALCIUM 9.5 07/13/2022 1015   PROT 7.7 07/13/2022 1015   PROT 7.9 06/20/2022 1652   ALBUMIN 4.4 06/20/2022 1652   AST 21 07/13/2022 1015   ALT 19 07/13/2022 1015   ALKPHOS 74 06/20/2022 1652   BILITOT 0.3 07/13/2022 1015   BILITOT <0.2 06/20/2022 1652   GFRNONAA 85 11/17/2018 1155   GFRAA 98 11/17/2018 1155   Lab Results  Component Value Date   CHOL 149 11/17/2018   HDL 59 11/17/2018   LDLCALC 74 11/17/2018   TRIG 76 11/17/2018   CHOLHDL 2.5 11/17/2018   Lab Results  Component Value Date   HGBA1C 4.9 07/06/2016   Lab Results  Component Value Date   VITAMINB12 855 06/20/2022   Lab Results  Component Value Date   TSH 0.851 06/20/2022        ASSESSMENT AND PLAN  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  This has efficacy for both MOGAD and Sjogren's disease.  She notes more Sjogren's disease symptoms the last 1 to 2 months of Peetz 13-month cycle.  I am going to change her dose from 1000 mg x 2 every 6 months to 1000 mg every 4 months.  Actemra could be considered if we ever need to make a switch in medication for the MOGAD though according to reference lessor online, it may not have much benefit in Sjogren's disease so she might require another treatment on top due to the proximal weakness,.  We will check IgG/IgM and anti-MOG today. 3.   We discussed that if the spasticity worsens we could try baclofen or tizanidine but she would like to hold off at this time.   4.  She will return to see us  as needed and call if she has new or worsening neurologic symptoms.   Lavonna Lampron A. Godwin Lat, MD, [facility] 06/06/2023, 2:32 PM Certified in Neurology, Clinical Neurophysiology, Sleep Medicine and Neuroimaging  [facility] [address], [address] [place], [place] [address] [phone]

## 2023-06-06 NOTE — Progress Notes (Deleted)
 Office Visit Note  Patient: Joyce [name]             Date of Birth: [birth year]           MRN: [id number]             PCP: Pcp, No Referring: No ref. provider found Visit Date: 06/17/2023   Subjective:  No chief complaint on file.   History of Present Illness: Zarriah Wheatly is a 43 y.o. 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  Previous HPI 12/18/2022 Annaleah Wareing is a 43 y.o. 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  Overall symptoms have been doing well with no serious eye irritation or visual changes.  She does report noticing some difference in dryness or mild gland swelling that is more improved shortly following infusion versus longer recommended treatment. She is planning on [place] trip including preventative vaccinations.   Previous HPI 04/25/2022 Nikolle Tanks is a 43 y.o. 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  Overall symptoms are largely improved with the treatment still gets some eye pain and irritation particularly later in the day he is using Systane drops for this.  She has not lost any of the previous unintentional weight gain so far.  Has been noticing some difficulty with standing up from prolonged stationary position but no specific weakness or instability while walking.  Currently scheduled for next infusions May 1 and May 15.  Has also noticed a small rash spot on her upper abdomen not particularly painful or itchy.     Previous HPI 10/11/21 Catelin Vogen is a 43 y.o. female here for follow up for sjogren syndrome. Since our last visit she saw Dr. Godwin Lat and diagnosed with MOGAD as likely cause of her optic neuritis and neurologic  symptoms she started rituximab infusions May 14 for this. Since starting the medication she reports a great improvement in dry eyes and mouth symptoms, no longer requiring drops or constant stimulation of saliva. She has no new infections since starting, including when her son was ill with COVID at home. She has gained about 15 pounds unintentionally, previously was very stable at about 150 lbs with little variation over years except during pregnancy.   Previous HPI 03/13/2021 Tanayia Theard is a 43 y.o. female here for follow up sjogren's syndrome she started hydroxychloroquine  after last visit but developed new skin rashes so stopped this medication.  Skin rash started about 3 weeks after starting the medicine and fully resolved within about 3 weeks after stopping.  Her biggest problem continues to be symptoms with her eyes which are dry and sometimes painful.  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  The pain improves once no longer staring at screens but quickly returns when looking towards the screens.  She uses the artificial tears type drops and has not noticed any other change or problem with visual acuity.  She also has mouth dryness this is less of a concern she drinks a lot of liquid and uses a mouth spray occasionally when needed. She reports her diarrhea resolves once starting the hydroxychloroquine  and this has stayed improved  despite discontinuing the medicine.  She reports some weight gain attributes this to eating out. She experiences a burning type of pain on the left thigh lasting about 2 weeks followed by about 1 week on the right upper arm the symptom has not recurred.  She is having some thigh pain and stiffness in the mornings bilaterally.     Previous HPI 01/03/21 Myalynn Hasbrook is a 43 y.o. female here for follow up for sjogren's syndrome. She had interval visits with Dr. Mason Sole findings consistent  with dry eyes but no evidence of neuritis or papilledema. She experienced episode of left lateral thigh pain lasting at least 2 weeks with burning sensation and worse with walking and sensation such as pants rubbing against skin. She also experienced an episode of pain on the back of the neck that then spontaneously resolved. She has noticed some increased hair coming out with washing and brushing although no focal thin or bald spots noticed. She has no visible skin changes on any of the affected areas. She continues to notice salivary gland swelling intermittently. She also reports persistent symptom of diarrhea that is ongoing for about 8 months having around 3 loose bowel movements daily. She is taking imodium daily for these symptoms. She denies any blood or mucus in stools. She has a family history of colonic polyps but no malignancy or inflammatory bowel disease.   Previous HPI 10/12/20 Cloie Kitchen is a 43 y.o. female here for evaluation of positive autoimmune serology checked in association with optic neuritis and mild asymmetric lacrimal gland size.  Symptoms started around July with right eye pain particularly with movement after symptoms persisted for 10 days he went for evaluation.  We will send to her work-up including eye exam demonstrating swelling around the optic nerve and MRI that was largely unremarkable possible small lacrimal gland enlargement.  She did not start any particular treatments or intervention for this symptoms did improve.  She started having symptoms on the left side last week and went for eye exam on Friday felt the findings were consistent with those on the right but at earlier stage. Besides this she did have an episode of right cheek and jaw pain a week ago improved spontaneously without treatment.  She has noticed new headaches typically start posteriorly and are exacerbated with forward bending leading to a bilateral frontal pounding type of pain.  She denies any sensation  of dry eyes or itching does sometimes feel a dry mouth.   Labs reviewed 2022 ANA pos SSA >8.0 SSB >8.0 RF 66 IgG 3800 Complement C4 12   No Rheumatology ROS completed.   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) 04/05/2021   Pain around right eye 03/13/2021   Neuropathic pain of thigh, left 01/03/2021   Sjogren syndrome, unspecified (HCC) 10/12/2020   Facial swelling 10/12/2020   Left optic neuritis 10/07/2020   Right optic neuritis 08/23/2020   Optic nerve edema 08/23/2020   Activities involving scuba diving 06/14/2017    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)    Optic neuritis    Sjogren's syndrome (HCC)     Family History  Problem Relation Age of Onset  Diabetes Mother    Colon polyps Mother    Hyperlipidemia Father    High blood pressure Father    Breast cancer Paternal Grandmother        Post menopause   Heart attack Paternal Grandfather    Past Surgical History:  Procedure Laterality Date   DILATION AND CURETTAGE OF UTERUS  2011   Social History   Social History Narrative   Pt lives with family    Pt works    Immunization History  Administered Date(s) Administered   Influenza,inj,Quad PF,6+ Mos 09/15/2018, 09/15/2018   Tdap 11/05/2016     Objective: Vital Signs: There were no vitals taken for this visit.   Physical Exam   Musculoskeletal Exam: ***  CDAI Exam: CDAI Score: -- Patient Global: --; Provider Global: -- Swollen: --; Tender: -- Joint Exam 06/17/2023   No joint exam has been documented for this visit   There is currently no information documented on the  homunculus. Go to the Rheumatology activity and complete the homunculus joint exam.  Investigation: No additional findings.  Imaging: No results found.  Recent Labs: Lab Results  Component Value Date   WBC 4.4 05/22/2023   HGB 12.5 05/22/2023   PLT 249 05/22/2023   NA 138 07/13/2022   K 4.8 07/13/2022   CL 106 07/13/2022   CO2 25 07/13/2022   GLUCOSE 79 07/13/2022   BUN 16 07/13/2022   CREATININE 0.66 07/13/2022   BILITOT 0.3 07/13/2022   ALKPHOS 74 06/20/2022   AST 21 07/13/2022   ALT 19 07/13/2022   PROT 7.7 07/13/2022   ALBUMIN 4.4 06/20/2022   CALCIUM 9.5 07/13/2022   GFRAA 98 11/17/2018   QFTBGOLDPLUS NEGATIVE 07/04/2022    Speciality Comments: No specialty comments available.  Procedures:  No procedures performed Allergies: Plaquenil  [hydroxychloroquine ]   Assessment / Plan:     Visit Diagnoses: No diagnosis found.  ***  Orders: No orders of the defined types were placed in this encounter.  No orders of the defined types were placed in this encounter.    Follow-Up Instructions: No follow-ups on file.   Glena Landau, RT  Note - This record has been created using [employer].  Chart creation errors have been sought, but may not always  have been located. Such creation errors do not reflect on  the standard of medical care.

## 2023-06-07 LAB — IGG, IGA, IGM
IgA/Immunoglobulin A, Serum: 109 mg/dL (ref 87–352)
IgG (Immunoglobin G), Serum: 1839 mg/dL — ABNORMAL HIGH (ref 586–1602)
IgM (Immunoglobulin M), Srm: 116 mg/dL (ref 26–217)

## 2023-06-07 LAB — ANTI-MOG, SERUM: MOG Antibody, Cell-based IFA: NEGATIVE

## 2023-06-08 ENCOUNTER — Encounter: Payer: Self-pay | Admitting: Neurology

## 2023-06-17 ENCOUNTER — Ambulatory Visit: Payer: Managed Care, Other (non HMO) | Admitting: Internal Medicine

## 2023-06-17 DIAGNOSIS — G3781 Myelin oligodendrocyte glycoprotein antibody disease: Secondary | ICD-10-CM

## 2023-06-17 DIAGNOSIS — M3500 Sicca syndrome, unspecified: Secondary | ICD-10-CM

## 2023-06-17 DIAGNOSIS — H469 Unspecified optic neuritis: Secondary | ICD-10-CM

## 2023-06-17 DIAGNOSIS — Z79899 Other long term (current) drug therapy: Secondary | ICD-10-CM

## 2023-07-01 ENCOUNTER — Ambulatory Visit
Admission: RE | Admit: 2023-07-01 | Discharge: 2023-07-01 | Disposition: A | Discharge status: Home / Self Care | Source: Ambulatory Visit | Attending: Family Medicine | Admitting: Family Medicine

## 2023-07-01 VITALS — BP 109/71 | HR 69 | Temp 98.6°F | Resp 18 | Ht 66.0 in | Wt 168.0 lb

## 2023-07-01 DIAGNOSIS — W5501XA Bitten by cat, initial encounter: Secondary | ICD-10-CM | POA: Diagnosis not present

## 2023-07-01 DIAGNOSIS — S0103XA Puncture wound without foreign body of scalp, initial encounter: Secondary | ICD-10-CM

## 2023-07-01 MED ORDER — DOXYCYCLINE HYCLATE 100 MG PO CAPS: 100.0000 mg | ORAL_CAPSULE | Freq: Two times a day (BID) | ORAL | 0 refills | Status: AC

## 2023-07-01 NOTE — Discharge Instructions (Addendum)
 Advised patient to take medication as directed with food to completion.  Encouraged to increase daily water intake to 64 ounces per day while taking this medication.  Advised if symptoms worsen and/or unresolved please follow-up with your PCP or here for further evaluation.

## 2023-07-01 NOTE — ED Provider Notes (Signed)
 [name]    CSN: [id number] Arrival date & time: 07/01/23  1003      History   Chief Complaint Chief Complaint  Patient presents with   Puncture Wound    Cat bite to the face - Entered by patient    HPI Joyce [name] is a 43 y.o. [name].   HPI Joyce 43 year old [name] presents with puncture wound to face and scalp caused by her cat last night per patient.  Reports that her cat was agitated by her sons playing.  PMH significant for MOGAD, optic neuritis and Sjogren's syndrome.  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) 04/05/2021   Pain around right eye 03/13/2021   Neuropathic pain of thigh, left 01/03/2021   Sjogren syndrome, unspecified (HCC) 10/12/2020   Facial swelling 10/12/2020   Left optic neuritis 10/07/2020   Right optic neuritis 08/23/2020   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 Authorizing Provider  doxycycline  (VIBRAMYCIN ) 100 MG capsule Take 1 capsule (100 mg total) by mouth 2 (two) times daily for 10 days. 07/01/23 07/11/23 Yes Leonides Ramp, FNP  Multiple Vitamin (MULTIVITAMIN)  tablet Take 1 tablet by mouth daily.   Yes [provider]  PARAGARD  INTRAUTERINE COPPER  IU by Intrauterine route.   Yes [provider]  riTUXimab (RITUXAN) 100 MG/10ML injection every 6 (six) months. 06/21/21  Yes [provider]    Family History Family History  Problem Relation Age of Onset   Diabetes Mother    Colon polyps Mother    Hyperlipidemia Father    High blood pressure Father    Breast cancer Paternal Grandmother        Post menopause   Heart attack Paternal Grandfather     Social History Social History   Tobacco Use   Smoking status: Never    Passive exposure: Never   Smokeless tobacco: Never  Vaping Use   Vaping status: Never Used  Substance Use Topics   Alcohol use: Yes    Comment: occasionally    Drug use: Never     Allergies   Plaquenil  [hydroxychloroquine ]   Review of Systems Review of Systems  Skin:  Positive for wound.  All other systems reviewed and are negative.    Physical Exam Triage Vital Signs ED Triage Vitals  Encounter Vitals Group     BP 07/01/23  1033 109/71     Systolic BP Percentile --      Diastolic BP Percentile --      Pulse Rate 07/01/23 1033 69     Resp 07/01/23 1033 18     Temp 07/01/23 1033 98.6 F (37 C)     Temp Source 07/01/23 1033 Oral     SpO2 07/01/23 1033 99 %     Weight 07/01/23 1035 168 lb (76.2 kg)     Height 07/01/23 1035 5\' 6"  (1.676 m)     Head Circumference --      Peak Flow --      Pain Score 07/01/23 1035 1     Pain Loc --      Pain Education --      Exclude from Growth Chart --    No data found.  Updated Vital Signs BP 109/71 (BP Location: Right Arm)   Pulse 69   Temp 98.6 F (37 C) (Oral)   Resp 18   Ht 5\' 6"  (1.676 m)   Wt 168 lb (76.2 kg)   SpO2 99%   BMI 27.12 kg/m     Physical Exam Vitals and nursing note reviewed.  Constitutional:      Appearance: Normal appearance. She is obese. She is not ill-appearing.  HENT:     Head: Normocephalic and  atraumatic.     Mouth/Throat:     Mouth: Mucous membranes are moist.     Pharynx: Oropharynx is clear.  Eyes:     Extraocular Movements: Extraocular movements intact.     Conjunctiva/sclera: Conjunctivae normal.     Pupils: Pupils are equal, round, and reactive to light.  Cardiovascular:     Rate and Rhythm: Normal rate and regular rhythm.     Pulses: Normal pulses.     Heart sounds: Normal heart sounds.  Pulmonary:     Effort: Pulmonary effort is normal.     Breath sounds: Normal breath sounds. No wheezing, rhonchi or rales.  Musculoskeletal:        General: Normal range of motion.  Skin:    General: Skin is warm and dry.     Comments: Right forehead/right superior scalp: Tiny pinpoint nonerythematous puncture wounds evident please see images below  Neurological:     General: No focal deficit present.     Mental Status: She is alert and oriented to person, place, and time. Mental status is at baseline.  Psychiatric:        Mood and Affect: Mood normal.        Behavior: Behavior normal.         UC Treatments / Results  Labs (all labs ordered are listed, but only abnormal results are displayed) Labs Reviewed - No data to display  EKG   Radiology No results found.  Procedures Procedures (including critical care time)  Medications Ordered in UC Medications - No data to display  Initial Impression / Assessment and Plan / UC Course  I have reviewed the triage vital signs and the nursing notes.  Pertinent labs & imaging results that were available during my care of the patient were reviewed by me and considered in my medical decision making (see chart for details).     MDM: 1.  Puncture wound of scalp, initial encounter-patient reports caused by her cat last night when agitated by her children, Rx'd doxycycline  100 mg capsule: Take 1 capsule twice daily x 10 days; 2.  Cat bite, initial encounter-Rx'd doxycycline  100 mg capsule: Take 1  capsule twice daily x 10  days. Final Clinical Impressions(s) / UC Diagnoses   Final diagnoses:  Cat bite, initial encounter  Puncture wound of scalp, initial encounter     Discharge Instructions      Advised patient to take medication as directed with food to completion.  Encouraged to increase daily water intake to 64 ounces per day while taking this medication.  Advised if symptoms worsen and/or unresolved please follow-up with your PCP or here for further evaluation.   ED Prescriptions     Medication Sig Dispense Auth. Provider   doxycycline  (VIBRAMYCIN ) 100 MG capsule Take 1 capsule (100 mg total) by mouth 2 (two) times daily for 10 days. 20 capsule Gjon Letarte, FNP      PDMP not reviewed this encounter.   Leonides Ramp, FNP 07/01/23 1130

## 2023-07-01 NOTE — ED Triage Notes (Addendum)
 Patient states that she was bitten by her cat last night on the face.  There are a few puncture wounds.  Tender to the touch.  Cat is current on rabies.  Tdap 2018.

## 2023-07-05 NOTE — Progress Notes (Signed)
 Office Visit Note  Patient: Joyce [name]             Date of Birth: November 14, 1980           MRN: [id number]             PCP: Pcp, No Referring: No ref. provider found Visit Date: 07/10/2023   Subjective:  Follow-up (Light sensitivity)   Discussed the use of AI scribe software for clinical note transcription with the patient, who gave verbal consent to proceed.  History of Present Illness   Joyce [name] is a 43 y.o. 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  She experiences worsening eye symptoms, including burning and photophobia, particularly under fluorescent lights. Significant discomfort occurs when using the computer or being in bright environments, despite using tinted glasses. Her eyes do not feel dry, but they cause significant pain. She uses over-the-counter eye drops, Systane, which provide some relief.  She has a history of using scopolamine for nausea, which resulted in vision problems. After three days of use, she was unable to read due to vision impairment. Although she discontinued the medication, her vision did not recover, and an eye doctor informed her that the medication accelerated the aging of her eyes, leading to presbyopia.  She receives Rituxan infusions for MOG antibody disease. Increased symptoms, such as swelling and achiness in the salivary glands and burning eyes, occurred two months prior to her most recent infusion. Previously, these symptoms appeared one month before the infusion. She is currently on a regimen of Rituxan every four months, with her last infusion in May.  She is negative for MOG antibodies but continues to experience symptoms related to Sjogren's syndrome, such as sensitivity to sour foods causing tongue burning and prolonged respiratory infections. No recent infections requiring antibiotics, aside from a cat scratch treated with doxycycline .  She  experiences numbness in her arms, particularly when seated in a car, which resolves quickly. Chronic back pain is exacerbated by activities like doing dishes, with pain radiating upwards. No rashes or circulation issues like fingers turning blue.       Previous HPI 12/18/2022 Joyce [name] is a 43 y.o. 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  Overall symptoms have been doing well with no serious eye irritation or visual changes.  She does report noticing some difference in dryness or mild gland swelling that is more improved shortly following infusion versus longer recommended treatment. She is planning on [place] trip including preventative vaccinations.   Previous HPI 04/25/2022 Joyce [name] is a 43 y.o. 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  Overall symptoms are largely improved with the treatment still gets some eye pain and irritation particularly later in the day he is using Systane drops for this.  She has not lost any of the previous unintentional weight gain so far.  Has been noticing some difficulty with standing up from prolonged stationary position but no specific weakness or instability while walking.  Currently scheduled for next infusions May 1 and May 15.  Has also noticed a small rash spot on her upper abdomen not particularly painful or itchy.     Previous HPI 10/11/21 Joyce [name] is a 43 y.o. female  here for follow up for sjogren syndrome. Since our last visit she saw Dr. Godwin Lat and diagnosed with MOGAD as likely cause of her optic neuritis and neurologic symptoms she started rituximab infusions May 14 for this. Since starting the medication she reports a great improvement in dry eyes and mouth symptoms, no longer requiring drops or  constant stimulation of saliva. She has no new infections since starting, including when her son was ill with COVID at home. She has gained about 15 pounds unintentionally, previously was very stable at about 150 lbs with little variation over years except during pregnancy.   Previous HPI 03/13/2021 Joyce [name] is a 43 y.o. female here for follow up sjogren's syndrome she started hydroxychloroquine  after last visit but developed new skin rashes so stopped this medication.  Skin rash started about 3 weeks after starting the medicine and fully resolved within about 3 weeks after stopping.  Her biggest problem continues to be symptoms with her eyes which are dry and sometimes painful.  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  The pain improves once no longer staring at screens but quickly returns when looking towards the screens.  She uses the artificial tears type drops and has not noticed any other change or problem with visual acuity.  She also has mouth dryness this is less of a concern she drinks a lot of liquid and uses a mouth spray occasionally when needed. She reports her diarrhea resolves once starting the hydroxychloroquine  and this has stayed improved despite discontinuing the medicine.  She reports some weight gain attributes this to eating out. She experiences a burning type of pain on the left thigh lasting about 2 weeks followed by about 1 week on the right upper arm the symptom has not recurred.  She is having some thigh pain and stiffness in the mornings bilaterally.     Previous HPI 01/03/21 Joyce [name] is a 43 y.o. female here for follow up for sjogren's syndrome. She had interval visits with Dr. Mason Sole findings consistent with dry eyes but no evidence of neuritis or papilledema. She experienced episode of left lateral thigh pain lasting at least 2 weeks with burning sensation and worse with walking and  sensation such as pants rubbing against skin. She also experienced an episode of pain on the back of the neck that then spontaneously resolved. She has noticed some increased hair coming out with washing and brushing although no focal thin or bald spots noticed. She has no visible skin changes on any of the affected areas. She continues to notice salivary gland swelling intermittently. She also reports persistent symptom of diarrhea that is ongoing for about 8 months having around 3 loose bowel movements daily. She is taking imodium daily for these symptoms. She denies any blood or mucus in stools. She has a family history of colonic polyps but no malignancy or inflammatory bowel disease.   Previous HPI 10/12/20 Joyce [name] is a 43 y.o. female here for evaluation of positive autoimmune serology checked in association with optic neuritis and mild asymmetric lacrimal gland size.  Symptoms started around July with right eye pain particularly with movement after symptoms persisted for 10 days he went for evaluation.  We will send to her work-up including eye exam demonstrating swelling around the optic nerve and MRI that was largely unremarkable possible small lacrimal gland enlargement.  She did not start any particular treatments or intervention  for this symptoms did improve.  She started having symptoms on the left side last week and went for eye exam on Friday felt the findings were consistent with those on the right but at earlier stage. Besides this she did have an episode of right cheek and jaw pain a week ago improved spontaneously without treatment.  She has noticed new headaches typically start posteriorly and are exacerbated with forward bending leading to a bilateral frontal pounding type of pain.  She denies any sensation of dry eyes or itching does sometimes feel a dry mouth.   Labs reviewed 2022 ANA pos SSA >8.0 SSB >8.0 RF 66 IgG 3800 Complement C4 12   Review of Systems   Constitutional:  Positive for fatigue.  HENT:  Negative for mouth sores and mouth dryness.   Eyes:  Positive for photophobia. Negative for dryness.  Respiratory:  Negative for shortness of breath.   Cardiovascular:  Negative for chest pain and palpitations.  Gastrointestinal:  Negative for blood in stool, constipation and diarrhea.  Endocrine: Negative for increased urination.  Genitourinary:  Negative for involuntary urination.  Musculoskeletal:  Negative for joint pain, gait problem, joint pain, joint swelling, myalgias, muscle weakness, morning stiffness, muscle tenderness and myalgias.  Skin:  Negative for color change, rash, hair loss and sensitivity to sunlight.  Allergic/Immunologic: Negative for susceptible to infections.  Neurological:  Negative for dizziness and headaches.  Hematological:  Negative for swollen glands.  Psychiatric/Behavioral:  Negative for depressed mood and sleep disturbance. The patient is not nervous/anxious.     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) 04/05/2021   Pain around right eye 03/13/2021   Neuropathic pain of thigh, left 01/03/2021   Sjogren syndrome, unspecified (HCC) 10/12/2020   Facial swelling 10/12/2020   Left optic neuritis 10/07/2020   Right optic neuritis 08/23/2020   Optic nerve edema 08/23/2020   Activities involving scuba diving 06/14/2017    Past Medical History:  Diagnosis Date   Acid reflux 07/08/2023   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)    Optic neuritis    Sjogren's syndrome (HCC)     Family History  Problem Relation Age of Onset   Diabetes Mother    Colon polyps Mother     Hyperlipidemia Father    High blood pressure Father    Breast cancer Paternal Grandmother        Post menopause   Heart attack Paternal Grandfather    Past Surgical History:  Procedure Laterality Date   DILATION AND CURETTAGE OF UTERUS  2011   Social History   Social History Narrative   Pt lives with family    Pt works    Immunization History  Administered Date(s) Administered   Influenza,inj,Quad PF,6+ Mos 09/15/2018, 09/15/2018   Tdap 11/05/2016     Objective: Vital Signs: BP 107/71 (BP Location: Left Arm, Patient Position: Sitting, Cuff Size: Normal)   Pulse 66   Resp 16   Ht 5' 6 (1.676 m)   Wt 170 lb 6.4 oz (77.3 kg)   BMI 27.50 kg/m    Physical Exam HENT:     Mouth/Throat:     Mouth: Mucous membranes are moist.     Pharynx: Oropharynx is clear.  Eyes:     Conjunctiva/sclera: Conjunctivae normal.    Cardiovascular:     Rate and Rhythm: Normal rate and regular rhythm.  Pulmonary:     Effort: Pulmonary effort is normal.     Breath sounds: Normal breath sounds.  Lymphadenopathy:     Cervical: No cervical adenopathy.   Skin:    General: Skin is warm and dry.     Findings: No rash.   Neurological:     Mental Status: She is alert.   Psychiatric:        Mood and Affect: Mood normal.      Musculoskeletal Exam:  Shoulders full ROM no tenderness or swelling Elbows full ROM no tenderness or swelling Wrists full ROM no tenderness or swelling Fingers full ROM no tenderness or swelling Knees full ROM no tenderness or swelling       Investigation: No additional findings.  Imaging: No results found.  Recent Labs: Lab Results  Component Value Date   WBC 5.1 07/08/2023   HGB 12.8 07/08/2023   PLT 259 07/08/2023   NA 139 07/08/2023   K 4.6 07/08/2023   CL 106 07/08/2023   CO2 21 07/08/2023   GLUCOSE 78 07/08/2023   BUN 10 07/08/2023   CREATININE 0.71 07/08/2023   BILITOT 0.3 07/13/2022   ALKPHOS 74 06/20/2022   AST 21 07/13/2022   ALT  19 07/13/2022   PROT 7.7 07/13/2022   ALBUMIN 4.4 06/20/2022   CALCIUM 9.4 07/08/2023   GFRAA 98 11/17/2018   QFTBGOLDPLUS NEGATIVE 07/04/2022    Speciality Comments: No specialty comments available.  Procedures:  No procedures performed Allergies: Doxycycline  and Plaquenil  [hydroxychloroquine ]   Assessment / Plan:     Visit Diagnoses: Sjogren syndrome, unspecified (HCC) - Plan: Sjogrens syndrome-A extractable nuclear antibody, Sjogrens syndrome-B extractable nuclear antibody, C3 and C4, Rheumatoid factor Persistent symptoms of eye irritation and sensitivity to sour foods. Recent seroconversion for MOG antibodies. Rituximab schedule adjusted for symptom recurrence. - Adjust rituximab schedule to single infusion at shorter intervals plan per neurology (ordering). - Consider oral medication to stimulate saliva production if symptoms persist.  High risk medication use - rituximab infusion every 6 months Reviewed previous trends labs including blood count metabolic panel and quantitative immunoglobulins checked with Dr. Godwin Lat and recent hospital encounter.  Has not experienced significant increased frequency of minor infections or any major infectious complications.  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 Minimal relief from blue light filtering glasses. Presbyopia likely accelerated by scopolamine use, causing difficulty with near vision and increased light sensitivity. No new treatment changes recommended.  Back pain Chronic back pain possibly due to scapular dyskinesia or levator scapulae muscle involvement. Discussed benefits of physical therapy over chiropractic care. Starting with conservative approach and discussed at home exercises. - Consider referral to physical therapy for evaluation and management.      Orders: Orders Placed This  Encounter  Procedures   Sjogrens syndrome-A extractable nuclear antibody   Sjogrens syndrome-B extractable nuclear antibody   C3 and C4   Rheumatoid factor   No orders of the defined types were placed in this encounter.    Follow-Up Instructions: Return in about 6 months (around 01/09/2024) for pSS/MOGAD on RTX f/u 6mos.   Matt Song, MD  Note - This record has been created using [employer].  Chart creation errors have been sought, but may not always  have been located. Such creation errors  do not reflect on  the standard of medical care.

## 2023-07-08 ENCOUNTER — Ambulatory Visit
Admission: RE | Admit: 2023-07-08 | Discharge: 2023-07-08 | Disposition: A | Discharge status: Home / Self Care | Payer: Self-pay | Source: Ambulatory Visit | Attending: Internal Medicine | Admitting: Internal Medicine

## 2023-07-08 VITALS — BP 112/72 | HR 71 | Temp 98.4°F | Resp 18

## 2023-07-08 DIAGNOSIS — R1013 Epigastric pain: Secondary | ICD-10-CM | POA: Diagnosis not present

## 2023-07-08 DIAGNOSIS — Z789 Other specified health status: Secondary | ICD-10-CM

## 2023-07-08 DIAGNOSIS — K219 Gastro-esophageal reflux disease without esophagitis: Secondary | ICD-10-CM

## 2023-07-08 HISTORY — DX: Gastro-esophageal reflux disease without esophagitis: K21.9

## 2023-07-08 MED ORDER — ALUM & MAG HYDROXIDE-SIMETH 200-200-20 MG/5ML PO SUSP
15.0000 mL | Freq: Once | ORAL | Status: AC
  Administered 2023-07-08: 15 mL via ORAL

## 2023-07-08 MED ORDER — LIDOCAINE VISCOUS HCL 2 % MT SOLN
15.0000 mL | Freq: Once | OROMUCOSAL | Status: AC
  Administered 2023-07-08: 15 mL via OROMUCOSAL

## 2023-07-08 MED ORDER — OMEPRAZOLE 20 MG PO CPDR: 20.0000 mg | DELAYED_RELEASE_CAPSULE | Freq: Every day | ORAL | 0 refills | Status: DC

## 2023-07-08 MED ORDER — FAMOTIDINE 20 MG PO TABS: 20.0000 mg | ORAL_TABLET | Freq: Every day | ORAL | 0 refills | Status: DC

## 2023-07-08 NOTE — Discharge Instructions (Addendum)
 Take prescribed medicines as directed. Medicine will help reduce the amount of acid your stomach makes and therefore improve your reflux symptoms related to acid production.   Omeprazole once daily in the morning, then famotidine at bedtime.   Avoid spicy or acidic foods like tomatoes, chocolate, coffee, or acidic fruits like oranges as these can trigger symptoms.  I have included acid reflux education in your packet for your review. Please also allow 2 hours after meals before lying flat to help prevent symptoms.   Labs will come back tomorrow and staff will call if abnormal.   Return to urgent care if you notice any redness or drainage from the site of the cat bites.  If your symptoms do not improve in the next 5-7 days with interventions, please return. Go to the emergency room for severe symptoms of shortness of breath, worsening or uncontrolled abdominal or chest pain, headache, light headedness, feeling faint, nausea, vomiting, bloody vomit or stools, black tarry stools, or any other new/severe symptoms. I hope you feel better!

## 2023-07-08 NOTE — ED Triage Notes (Signed)
 Patient was recently started on Doxycycline  for an animal bite on 07/01/2023 for a cat bite.  Patient states that on Saturday night, she woke up with the worst heartburn, felt like someone was squeezing her sternum.  The pain subsided yesterday, but still unable to eat or drink due to the heartburn, pt stopped taken the Doxycycline  on Saturday evening.  Patient has taken Maalox w/minimal relief.

## 2023-07-08 NOTE — ED Provider Notes (Signed)
 [name]    CSN: [id number] Arrival date & time: 07/08/23  0916      History   Chief Complaint Chief Complaint  Patient presents with   Heartburn    Reaction to doxycycline - unable to eat or drink without severe chest pain and feeling as though my throat is constricted - Entered by patient    HPI Joyce [name] is a 43 y.o. female.   Joyce [name] is a 43 y.o. female presenting for chief complaint of epigastric pain and esophageal spasm that started on day 6 of taking doxycycline  antibiotic to treat infection from recent cat bite. Starting on day 4-5 of the antibiotic, she began to have heartburn and epigastric discomfort. On day 6, Saturday May 31st (2 days ago), she woke up in the middle of the night with epigastric pain and esophageal spasm that worsened into yesterday morning. She admits to eating pizza for dinner 2 nights ago before waking up with epigastric pain. Denies recent intake of alcohol, fatty/spicy foods, NSAID use, dizziness, nausea, vomiting, diarrhea, lower abdominal pain, flank pain, and history of surgeries to the abdomen. Epigastric pain does not radiate to the upper chest.  Denies chance of pregnancy (IUD). She denies history of acid reflux/heartburn in the past. This is her first time taking doxycycline  antibiotic. She reports reduced appetite today and has been able to eat a small amount of applesauce, pain to the epigastrium worsens significantly with eating. She stopped taking antibiotic 2 days ago and has not noted any signs of infection to the cat bite of the head. Taking Maalox with minimal relief.    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) 04/05/2021   Pain around right eye 03/13/2021   Neuropathic pain of thigh, left 01/03/2021   Sjogren syndrome, unspecified (HCC) 10/12/2020   Facial swelling 10/12/2020   Left optic neuritis 10/07/2020   Right optic neuritis 08/23/2020   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 Authorizing Provider  doxycycline  (VIBRAMYCIN ) 100 MG capsule Take 1 capsule (100 mg total) by mouth 2 (two) times daily for 10 days. 07/01/23 07/11/23 Yes Leonides Ramp, FNP  famotidine (PEPCID) 20 MG tablet Take 1 tablet (20 mg total) by mouth at bedtime. 07/08/23  Yes Starlene Eaton, FNP  Multiple Vitamin (MULTIVITAMIN) tablet Take 1 tablet by mouth  daily.   Yes [provider]  omeprazole (PRILOSEC) 20 MG capsule Take 1 capsule (20 mg total) by mouth daily. 07/08/23  Yes Starlene Eaton, FNP  PARAGARD  INTRAUTERINE COPPER  IU by Intrauterine route.   Yes [provider]  riTUXimab (RITUXAN) 100 MG/10ML injection every 6 (six) months. 06/21/21  Yes [provider]    Family History Family History  Problem Relation Age of Onset   Diabetes Mother    Colon polyps Mother    Hyperlipidemia Father    High blood pressure Father    Breast cancer Paternal Grandmother        Post menopause   Heart attack Paternal Grandfather     Social History Social History   Tobacco Use   Smoking status: Never     Passive exposure: Never   Smokeless tobacco: Never  Vaping Use   Vaping status: Never Used  Substance Use Topics   Alcohol use: Yes    Comment: occasionally    Drug use: Never     Allergies   Doxycycline  and Plaquenil  [hydroxychloroquine ]   Review of Systems Review of Systems  Gastrointestinal:  Positive for heartburn.  Per HPI   Physical Exam Triage Vital Signs ED Triage Vitals  Encounter Vitals Group     BP 07/08/23 0924 112/72     Systolic BP Percentile --      Diastolic BP Percentile --      Pulse Rate 07/08/23 0924 71     Resp 07/08/23 0924 18     Temp 07/08/23 0924 98.4 F (36.9 C)     Temp Source 07/08/23 0924 Oral     SpO2 07/08/23 0924 96 %     Weight --      Height --      Head Circumference --      Peak Flow --      Pain Score 07/08/23 0925 3     Pain Loc --      Pain Education --      Exclude from Growth Chart --    No data found.  Updated Vital Signs BP 112/72 (BP Location: Right Arm)   Pulse 71   Temp 98.4 F (36.9 C) (Oral)   Resp 18   SpO2 96%   Visual Acuity Right Eye Distance:   Left Eye Distance:   Bilateral Distance:    Right Eye Near:   Left Eye Near:    Bilateral Near:     Physical Exam Vitals and nursing note reviewed.  Constitutional:      Appearance: She is not ill-appearing or toxic-appearing.  HENT:     Head: Normocephalic and atraumatic.     Right Ear: Hearing and external ear normal.     Left Ear: Hearing and external ear normal.     Nose: Nose normal.     Mouth/Throat:     Lips: Pink.     Mouth: Mucous membranes are moist.     Pharynx: No posterior oropharyngeal erythema.  Eyes:     General: Lids are normal. Vision grossly intact. Gaze aligned appropriately.     Extraocular Movements: Extraocular movements intact.     Conjunctiva/sclera: Conjunctivae normal.  Cardiovascular:     Rate and Rhythm: Normal rate and regular rhythm.     Heart sounds: Normal heart sounds, S1 normal and S2 normal.  Pulmonary:      Effort: Pulmonary effort is normal. No respiratory distress.     Breath sounds: Normal breath sounds and air entry.  Abdominal:     General: Abdomen is flat. Bowel sounds are normal.     Palpations: Abdomen is soft.     Tenderness: There is abdominal tenderness (mild tenderness elicited with palpation of the epigastrium, otherwise non-tender abdomen) in the epigastric area. There is no right CVA tenderness, left CVA tenderness, guarding or rebound. Negative signs include Murphy's sign, Rovsing's sign and McBurney's sign.     Comments: No peritoneal signs.  Musculoskeletal:     Cervical back: Neck supple.  Skin:    General: Skin is warm and dry.     Capillary Refill: Capillary refill takes less than 2 seconds.     Findings: No rash.  Neurological:     General: No focal deficit present.     Mental Status: She is alert and oriented to person, place, and time. Mental status is at baseline.     Cranial Nerves: No dysarthria or facial asymmetry.  Psychiatric:        Mood and Affect: Mood normal.        Speech: Speech normal.        Behavior: Behavior normal.        Thought Content: Thought content normal.        Judgment: Judgment normal.      UC Treatments / Results  Labs (all labs ordered are listed, but only abnormal results are displayed) Labs Reviewed  CBC  BASIC METABOLIC PANEL WITH GFR  LIPASE    EKG   Radiology No results found.  Procedures Procedures (including critical care time)  Medications Ordered in UC Medications  lidocaine (XYLOCAINE) 2 % viscous mouth solution 15 mL (15 mLs Mouth/Throat Given 07/08/23 1002)  alum & mag hydroxide-simeth (MAALOX/MYLANTA) 200-200-20 MG/5ML suspension 15 mL (15 mLs Oral Given 07/08/23 1002)    Initial Impression / Assessment and Plan / UC Course  I have reviewed the triage vital signs and the nursing notes.  Pertinent labs & imaging results that were available during my care of the patient were reviewed by me and considered in  my medical decision making (see chart for details).   1.  Epigastric abdominal pain, medication intolerance Per up-to-date, doxycycline  has been noted to cause gastritis and esophagitis.  Symptoms were tolerable until she ate pizza, then became more severe and triggered epigastric abdominal pain. Low suspicion for intra-abdominal pathology/cardiac etiology of symptoms.  Low suspicion for PE/AAA.  Vital signs are hemodynamically stable and she is well-appearing on exam. Cat bite for which doxycycline  was used to treat infection appears to be healing well and without drainage or erythema.  Basic blood work drawn today to evaluate for intra-abdominal abnormality.  Will treat with supportive care including dietary and behavioral changes to treat/prevent gastritis symptoms, omeprazole daily each morning for 4 weeks, famotidine daily each night for 4 weeks, and GI cocktail in clinic.  She may use Tylenol as needed for aches and pains.  Recommend follow-up with primary care provider for ongoing evaluation and management of this in the next 5 to 7 days.  Doxycycline  documented as an intolerance in patients chart.  Counseled patient on potential for adverse effects with medications prescribed/recommended today, strict ER and return-to-clinic precautions discussed, patient verbalized understanding.    Final Clinical Impressions(s) / UC Diagnoses   Final diagnoses:  Medication intolerance  Abdominal pain, epigastric     Discharge Instructions      Take prescribed medicines as directed. Medicine will help reduce the amount of acid your stomach makes and therefore improve your  reflux symptoms related to acid production.   Omeprazole once daily in the morning, then famotidine at bedtime.   Avoid spicy or acidic foods like tomatoes, chocolate, coffee, or acidic fruits like oranges as these can trigger symptoms.  I have included acid reflux education in your packet for your review. Please also  allow 2 hours after meals before lying flat to help prevent symptoms.   Labs will come back tomorrow and staff will call if abnormal.   Return to urgent care if you notice any redness or drainage from the site of the cat bites.  If your symptoms do not improve in the next 5-7 days with interventions, please return. Go to the emergency room for severe symptoms of shortness of breath, worsening or uncontrolled abdominal or chest pain, headache, light headedness, feeling faint, nausea, vomiting, bloody vomit or stools, black tarry stools, or any other new/severe symptoms. I hope you feel better!     ED Prescriptions     Medication Sig Dispense Auth. Provider   omeprazole (PRILOSEC) 20 MG capsule Take 1 capsule (20 mg total) by mouth daily. 30 capsule Starlene Eaton, FNP   famotidine (PEPCID) 20 MG tablet Take 1 tablet (20 mg total) by mouth at bedtime. 30 tablet Starlene Eaton, FNP      PDMP not reviewed this encounter.   Starlene Eaton, [place] 07/08/23 1038

## 2023-07-09 ENCOUNTER — Ambulatory Visit: Payer: Self-pay | Admitting: Internal Medicine

## 2023-07-09 LAB — CBC
Hematocrit: 38.8 % (ref 34.0–46.6)
Hemoglobin: 12.8 g/dL (ref 11.1–15.9)
MCH: 28.4 pg (ref 26.6–33.0)
MCHC: 33 g/dL (ref 31.5–35.7)
MCV: 86 fL (ref 79–97)
Platelets: 259 10*3/uL (ref 150–450)
RBC: 4.51 x10E6/uL (ref 3.77–5.28)
RDW: 12.6 % (ref 11.7–15.4)
WBC: 5.1 10*3/uL (ref 3.4–10.8)

## 2023-07-09 LAB — BASIC METABOLIC PANEL WITH GFR
BUN/Creatinine Ratio: 14 (ref 9–23)
BUN: 10 mg/dL (ref 6–24)
CO2: 21 mmol/L (ref 20–29)
Calcium: 9.4 mg/dL (ref 8.7–10.2)
Chloride: 106 mmol/L (ref 96–106)
Creatinine, Ser: 0.71 mg/dL (ref 0.57–1.00)
Glucose: 78 mg/dL (ref 70–99)
Potassium: 4.6 mmol/L (ref 3.5–5.2)
Sodium: 139 mmol/L (ref 134–144)
eGFR: 108 mL/min/{1.73_m2} (ref >59)

## 2023-07-09 LAB — LIPASE: Lipase: 34 U/L (ref 14–72)

## 2023-07-10 ENCOUNTER — Encounter: Payer: Self-pay | Admitting: Internal Medicine

## 2023-07-10 ENCOUNTER — Ambulatory Visit: Payer: Self-pay | Attending: Internal Medicine | Admitting: Internal Medicine

## 2023-07-10 VITALS — BP 107/71 | HR 66 | Resp 16 | Ht 66.0 in | Wt 170.4 lb

## 2023-07-10 DIAGNOSIS — G3781 Myelin oligodendrocyte glycoprotein antibody disease: Secondary | ICD-10-CM

## 2023-07-10 DIAGNOSIS — H469 Unspecified optic neuritis: Secondary | ICD-10-CM

## 2023-07-10 DIAGNOSIS — Z79899 Other long term (current) drug therapy: Secondary | ICD-10-CM | POA: Diagnosis not present

## 2023-07-10 DIAGNOSIS — M3500 Sicca syndrome, unspecified: Secondary | ICD-10-CM

## 2023-07-11 LAB — SJOGRENS SYNDROME-B EXTRACTABLE NUCLEAR ANTIBODY: SSB (La) (ENA) Antibody, IgG: >8 AI — AB

## 2023-07-11 LAB — RHEUMATOID FACTOR: Rheumatoid fact SerPl-aCnc: <10 [IU]/mL (ref <14)

## 2023-07-11 LAB — C3 AND C4
C3 Complement: 142 mg/dL (ref 83–193)
C4 Complement: 17 mg/dL (ref 15–57)

## 2023-07-11 LAB — SJOGRENS SYNDROME-A EXTRACTABLE NUCLEAR ANTIBODY: SSA (Ro) (ENA) Antibody, IgG: >8 AI — AB

## 2024-01-10 NOTE — Progress Notes (Unsigned)
 Office Visit Note  Patient: Joyce [name]             Date of Birth: October 09, 1980           MRN: [id number]             PCP: Pcp, No Referring: No ref. provider found Visit Date: 01/13/2024   Subjective:  No chief complaint on file.   History of Present Illness: Joyce [name] is a 43 y.o. 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   Previous HPI 07/10/2023 Joyce [name] is a 43 y.o. 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   She experiences worsening eye symptoms, including burning and photophobia, particularly under fluorescent lights. Significant discomfort occurs when using the computer or being in bright environments, despite using tinted glasses. Her eyes do not feel dry, but they cause significant pain. She uses over-the-counter eye drops, Systane, which provide some relief.   She has a history of using scopolamine for nausea, which resulted in vision problems. After three days of use, she was unable to read due to vision impairment. Although she discontinued the medication, her vision did not recover, and an eye doctor informed her that the medication accelerated the aging of her eyes, leading to presbyopia.   She receives Rituxan infusions for MOG antibody disease. Increased symptoms, such as swelling and achiness in the salivary glands and burning eyes, occurred two months prior to her most recent infusion. Previously, these symptoms appeared one month before the infusion. She is currently on a regimen of Rituxan every four months, with her last infusion in May.   She is negative for MOG antibodies but continues to experience symptoms related to Sjogren's syndrome, such as sensitivity to sour foods causing tongue burning and prolonged respiratory infections. No recent infections requiring antibiotics,  aside from a cat scratch treated with doxycycline .   She experiences numbness in her arms, particularly when seated in a car, which resolves quickly. Chronic back pain is exacerbated by activities like doing dishes, with pain radiating upwards. No rashes or circulation issues like fingers turning blue.         Previous HPI 12/18/2022 Joyce [name] is a 43 y.o. 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  Overall symptoms have been doing well with no serious eye irritation or visual changes.  She does report noticing some difference in dryness or mild gland swelling that is more improved shortly following infusion versus longer recommended treatment. She is planning on [place] trip including preventative vaccinations.   Previous HPI 04/25/2022 Joyce [name] is a 43 y.o. 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  Overall symptoms are largely improved with the treatment still gets some eye pain and irritation particularly later in the day he is using Systane drops for this.  She has not lost any of the previous unintentional weight gain so far.  Has been noticing some difficulty with standing up from prolonged stationary position but no specific weakness or instability while walking.  Currently scheduled for next infusions May 1 and May 15.  Has also noticed a small rash spot on her upper abdomen not particularly  painful or itchy.     Previous HPI 10/11/21 Joyce [name] is a 43 y.o. female here for follow up for sjogren syndrome. Since our last visit she saw Dr. Vear and diagnosed with MOGAD as likely cause of her optic neuritis and neurologic symptoms she started rituximab infusions May 14 for this. Since starting the medication she reports a great improvement in  dry eyes and mouth symptoms, no longer requiring drops or constant stimulation of saliva. She has no new infections since starting, including when her son was ill with COVID at home. She has gained about 15 pounds unintentionally, previously was very stable at about 150 lbs with little variation over years except during pregnancy.   Previous HPI 03/13/2021 Joyce [name] is a 43 y.o. female here for follow up sjogren's syndrome she started hydroxychloroquine  after last visit but developed new skin rashes so stopped this medication.  Skin rash started about 3 weeks after starting the medicine and fully resolved within about 3 weeks after stopping.  Her biggest problem continues to be symptoms with her eyes which are dry and sometimes painful.  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  The pain improves once no longer staring at screens but quickly returns when looking towards the screens.  She uses the artificial tears type drops and has not noticed any other change or problem with visual acuity.  She also has mouth dryness this is less of a concern she drinks a lot of liquid and uses a mouth spray occasionally when needed. She reports her diarrhea resolves once starting the hydroxychloroquine  and this has stayed improved despite discontinuing the medicine.  She reports some weight gain attributes this to eating out. She experiences a burning type of pain on the left thigh lasting about 2 weeks followed by about 1 week on the right upper arm the symptom has not recurred.  She is having some thigh pain and stiffness in the mornings bilaterally.     Previous HPI 01/03/21 Joyce [name] is a 43 y.o. female here for follow up for sjogren's syndrome. She had interval visits with Dr. Maree findings consistent with dry eyes but no evidence of neuritis or papilledema. She experienced episode of left lateral thigh pain lasting at least  2 weeks with burning sensation and worse with walking and sensation such as pants rubbing against skin. She also experienced an episode of pain on the back of the neck that then spontaneously resolved. She has noticed some increased hair coming out with washing and brushing although no focal thin or bald spots noticed. She has no visible skin changes on any of the affected areas. She continues to notice salivary gland swelling intermittently. She also reports persistent symptom of diarrhea that is ongoing for about 8 months having around 3 loose bowel movements daily. She is taking imodium daily for these symptoms. She denies any blood or mucus in stools. She has a family history of colonic polyps but no malignancy or inflammatory bowel disease.   Previous HPI 10/12/20 Joyce [name] is a 43 y.o. female here for evaluation of positive autoimmune serology checked in association with optic neuritis and mild asymmetric lacrimal gland size.  Symptoms started around July with right eye pain particularly with movement after symptoms persisted for 10 days he went for evaluation.  We will send to her work-up including eye exam demonstrating swelling around the optic nerve and MRI that was  largely unremarkable possible small lacrimal gland enlargement.  She did not start any particular treatments or intervention for this symptoms did improve.  She started having symptoms on the left side last week and went for eye exam on Friday felt the findings were consistent with those on the right but at earlier stage. Besides this she did have an episode of right cheek and jaw pain a week ago improved spontaneously without treatment.  She has noticed new headaches typically start posteriorly and are exacerbated with forward bending leading to a bilateral frontal pounding type of pain.  She denies any sensation of dry eyes or itching does sometimes feel a dry mouth.   Labs reviewed 2022 ANA pos SSA >8.0 SSB >8.0 RF 66 IgG  3800 Complement C4 12   No Rheumatology ROS completed.   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   Activities involving scuba diving 06/14/2017    Past Medical History:  Diagnosis Date   Acid reflux 07/08/2023   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)    Optic neuritis    Sjogren's syndrome     Family History  Problem Relation Age of Onset   Diabetes Mother    Colon polyps Mother    Hyperlipidemia Father    High blood pressure Father    Breast cancer Paternal Grandmother        Post menopause   Heart attack Paternal Grandfather    Past Surgical History:  Procedure Laterality Date   DILATION AND CURETTAGE OF UTERUS  2011   Social History   Social History Narrative   Pt lives with family    Pt works    Immunization History  Administered Date(s) Administered   Influenza,inj,Quad PF,6+ Mos 09/15/2018, 09/15/2018   Tdap 11/05/2016     Objective: Vital Signs: There were no vitals taken for this visit.   Physical Exam   Musculoskeletal Exam: ***  CDAI Exam: CDAI Score: -- Patient Global: --; Provider Global: -- Swollen: --; Tender: -- Joint Exam 01/13/2024   No joint exam has been documented for this visit   There is currently no information documented on the homunculus. Go to the Rheumatology activity and complete the homunculus joint exam.  Investigation: No  additional findings.  Imaging: No results found.  Recent Labs: Lab Results  Component Value Date   WBC 5.1 07/08/2023   HGB 12.8 07/08/2023   PLT 259 07/08/2023   NA 139 07/08/2023   K 4.6 07/08/2023   CL 106 07/08/2023   CO2 21 07/08/2023   GLUCOSE 78 07/08/2023   BUN 10 07/08/2023   CREATININE 0.71 07/08/2023   BILITOT 0.3 07/13/2022   ALKPHOS 74 06/20/2022   AST 21 07/13/2022   ALT 19 07/13/2022   PROT 7.7 07/13/2022   ALBUMIN 4.4 06/20/2022   CALCIUM 9.4 07/08/2023   GFRAA 98 11/17/2018   QFTBGOLDPLUS NEGATIVE 07/04/2022    Speciality Comments: No specialty comments available.  Procedures:  No  procedures performed Allergies: Doxycycline  and Plaquenil  [hydroxychloroquine ]   Assessment / Plan:     Visit Diagnoses: No diagnosis found.  ***  Orders: No orders of the defined types were placed in this encounter.  No orders of the defined types were placed in this encounter.    Follow-Up Instructions: No follow-ups on file.   Rupinder Livingston M Qaadir Kent, CMA  Note - This record has been created using Animal nutritionist.  Chart creation errors have been sought, but may not always  have been located. Such creation errors do not reflect on  the standard of medical care.

## 2024-01-13 ENCOUNTER — Ambulatory Visit: Attending: Internal Medicine | Admitting: Internal Medicine

## 2024-01-13 ENCOUNTER — Encounter: Payer: Self-pay | Admitting: Internal Medicine

## 2024-01-13 VITALS — BP 100/67 | HR 73 | Temp 97.7°F | Resp 16 | Ht 66.0 in | Wt 175.8 lb

## 2024-01-13 DIAGNOSIS — Z5181 Encounter for therapeutic drug level monitoring: Secondary | ICD-10-CM | POA: Insufficient documentation

## 2024-01-13 DIAGNOSIS — R29898 Other symptoms and signs involving the musculoskeletal system: Secondary | ICD-10-CM

## 2024-01-13 DIAGNOSIS — G3781 Myelin oligodendrocyte glycoprotein antibody disease: Secondary | ICD-10-CM

## 2024-01-13 DIAGNOSIS — M792 Neuralgia and neuritis, unspecified: Secondary | ICD-10-CM

## 2024-01-13 DIAGNOSIS — Z79899 Other long term (current) drug therapy: Secondary | ICD-10-CM | POA: Insufficient documentation

## 2024-01-13 DIAGNOSIS — M3500 Sicca syndrome, unspecified: Secondary | ICD-10-CM

## 2024-01-13 MED ORDER — PILOCARPINE HCL 5 MG PO TABS: 5.0000 mg | ORAL_TABLET | Freq: Every day | ORAL | 0 refills | Status: AC

## 2024-01-13 MED ORDER — PILOCARPINE HCL 5 MG PO TABS: 5.0000 mg | ORAL_TABLET | Freq: Every day | ORAL | 0 refills | Status: DC

## 2024-01-13 NOTE — Patient Instructions (Addendum)
 Strengthening exercises Straight leg raises, side-lying This exercise is sometimes called a hip abductor exercise. It strengthens the muscles that rotate the leg at the hip and move it away from your body (hip abduction). Lie on your side with your left / right leg in the top position. Lie so your head, shoulder, hip, and knee line up. Bend your bottom knee slightly to help you balance. Lift your top leg 4-6 inches (10-15 cm) while keeping your toes pointed straight ahead. Hold this position for __________ seconds. Slowly lower your leg to the starting position. Let your muscles relax completely after each repetition. Repeat __________ times. Complete this exercise __________ times a day. Hip abductors and rotators, quadruped This exercise strengthens the muscles that keep the hip together (hip rotators) along with the muscles that move the leg and hip away from your body (hip abductors). Get on your hands and knees on a firm, lightly padded surface. This is the quadruped position. Your hands should be directly below your shoulders, and your knees should be directly below your hips. Lift your left / right knee out to the side. Keep your knee bent. Do not twist your body. Hold this position for __________ seconds. Slowly lower your leg back to the starting position. Repeat __________ times. Complete this exercise __________ times a day. Bridge This exercise strengthens the muscles that move your thigh backward (hip extensors). Lie on your back on a firm surface with your knees bent and your feet flat on the floor. Tighten your butt muscles and lift your bottom off the floor until the trunk of your body is level with your thighs. You should feel the muscles working in your butt and the back of your thighs. If this exercise is too easy, cross your arms over your chest or lift one leg while your bottom is up and off the floor. Do not arch your back. Hold this position for __________  seconds. Slowly lower your hips to the starting position. Let your muscles relax completely after each repetition. Repeat __________ times. Complete this exercise __________ times a day. These exercises build strength and endurance in your hip and pelvis. Endurance is the ability to use your muscles for a long time, even after they get tired.

## 2024-01-15 LAB — LYMPHOCYTE ENUMERATION, BASIC
%CD19 (Earliest B-cells): 0 % — ABNORMAL LOW (ref 6–29)
%CD3 (Mature T-Cells): 90 % — ABNORMAL HIGH (ref 57–85)
%CD8 (Cytotoxic/Suppressor): 19 % (ref 12–42)
Absolute CD19: <20 {cells}/uL — ABNORMAL LOW (ref 110–660)
Absolute CD3: 1155 {cells}/uL (ref 840–3060)
Absolute CD4: 863 {cells}/uL (ref 490–1740)
CD4 T Helper %: 70 % — ABNORMAL HIGH (ref 30–61)
CD4/CD8 Ratio: 3.66 (ref 0.86–5.00)
CD8 T Cell Abs: 236 {cells}/uL (ref 180–1170)
Total lymphocyte count: 1286 {cells}/uL (ref 850–3900)

## 2024-01-15 LAB — CBC WITH DIFFERENTIAL/PLATELET
Absolute Lymphocytes: 1325 {cells}/uL (ref 850–3900)
Absolute Monocytes: 980 {cells}/uL — ABNORMAL HIGH (ref 200–950)
Basophils Absolute: 62 {cells}/uL (ref 0–200)
Basophils Relative: 0.9 %
Eosinophils Absolute: 152 {cells}/uL (ref 15–500)
Eosinophils Relative: 2.2 %
HCT: 35.7 % — ABNORMAL LOW (ref 35.9–46.0)
Hemoglobin: 11.8 g/dL (ref 11.7–15.5)
MCH: 28.6 pg (ref 27.0–33.0)
MCHC: 33.1 g/dL (ref 31.6–35.4)
MCV: 86.4 fL (ref 81.4–101.7)
MPV: 10.9 fL (ref 7.5–12.5)
Monocytes Relative: 14.2 %
Neutro Abs: 4382 {cells}/uL (ref 1500–7800)
Neutrophils Relative %: 63.5 %
Platelets: 250 Thousand/uL (ref 140–400)
RBC: 4.13 Million/uL (ref 3.80–5.10)
RDW: 12.4 % (ref 11.0–15.0)
Total Lymphocyte: 19.2 %
WBC: 6.9 Thousand/uL (ref 3.8–10.8)

## 2024-01-15 LAB — COMPREHENSIVE METABOLIC PANEL WITH GFR
AG Ratio: 1.7 (calc) (ref 1.0–2.5)
ALT: 23 U/L (ref 6–29)
AST: 26 U/L (ref 10–30)
Albumin: 4.5 g/dL (ref 3.6–5.1)
Alkaline phosphatase (APISO): 51 U/L (ref 31–125)
BUN: 14 mg/dL (ref 7–25)
CO2: 28 mmol/L (ref 20–32)
Calcium: 9.2 mg/dL (ref 8.6–10.2)
Chloride: 104 mmol/L (ref 98–110)
Creat: 0.72 mg/dL (ref 0.50–0.99)
Globulin: 2.6 g/dL (ref 1.9–3.7)
Glucose, Bld: 83 mg/dL (ref 65–99)
Potassium: 4.1 mmol/L (ref 3.5–5.3)
Sodium: 139 mmol/L (ref 135–146)
Total Bilirubin: 0.3 mg/dL (ref 0.2–1.2)
Total Protein: 7.1 g/dL (ref 6.1–8.1)
eGFR: 106 mL/min/1.73m2 (ref >=60)

## 2024-01-15 LAB — C3 AND C4
C3 Complement: 117 mg/dL (ref 83–193)
C4 Complement: 15 mg/dL (ref 15–57)

## 2024-01-15 LAB — RHEUMATOID FACTOR: Rheumatoid fact SerPl-aCnc: 11 [IU]/mL (ref <14)

## 2024-01-15 LAB — CK: Total CK: 384 U/L — ABNORMAL HIGH (ref 20–239)

## 2024-01-20 ENCOUNTER — Encounter: Payer: Self-pay | Admitting: Neurology

## 2024-01-20 ENCOUNTER — Ambulatory Visit: Admitting: Neurology

## 2024-01-20 VITALS — BP 123/87 | HR 79 | Ht 66.0 in | Wt 174.5 lb

## 2024-01-20 DIAGNOSIS — M3500 Sicca syndrome, unspecified: Secondary | ICD-10-CM

## 2024-01-20 DIAGNOSIS — G3781 Myelin oligodendrocyte glycoprotein antibody disease: Secondary | ICD-10-CM | POA: Diagnosis not present

## 2024-01-20 DIAGNOSIS — Z79899 Other long term (current) drug therapy: Secondary | ICD-10-CM

## 2024-01-20 DIAGNOSIS — H469 Unspecified optic neuritis: Secondary | ICD-10-CM

## 2024-01-20 NOTE — Progress Notes (Signed)
 [facility]  PATIENT: Joyce [name] DOB: [birth year]  REFERRING DOCTOR OR PCP: Camellia Quay, OD; Laneta Blunt (PCP) SOURCE: Patient, notes from optometry.  _________________________________   HISTORICAL  CHIEF COMPLAINT:  Chief Complaint  Patient presents with   Follow-up    Room12, alone, Myelin oligodendrocyte glycoprotein antibody disorder (MOGAD) , Left optic neuritis, Right optic neuritis       HISTORY OF PRESENT ILLNESS:  Joyce [name] is a 43 y.o. woman with eye pain and possible optic neuritis.  UPDATE 01/20/2024: She is on Rituxan for MOGAD.  Although she has not had MOGAD exacerbation, her Sjogren's syndrome is not sufficiently controlled Dr. Jeannetta has discussed other options with her including azathioprine and MMF.  I reviewed her last visit note with rheumatology.  There have been some small studies showing probable benefit with MMF or azathioprine.  She has had more arm dysesthesia -this is not severe so unlikely to be due to a MOGAD exacerbation.   She has dry eyes > dry mouth.  She is on a dry eye drop Caron) and will be starting pilocarpine .    She feels strength is about the same as last visit.  Gait is fine.   Vision is blurry, but much better when she wears her glasses.  Color vision is symmetric.    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  The episodes of left and right optic neuritis occurred at different times.  She has not had a spinal cord syndrome. She denies new visual issues.   She saw ophthalmology and ws told her eye exam was unchanged compared to last visit.  She is noting a lot of leg cramps, right > left.    She notes the muscles stiffens up and takes a couple minutes to relax.   These occur randomly, day > night.   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  Visual History: On 08/04/2020, she had the onset of eye pain .  Pain peaked about a week later and then  began to improve.   Movement of the eye worsened the pain.    By 08/15/2020, pain was only occurring on far lateral gaze.  That day, she saw Dr. Quay.  The dilated eye exam showed a slightly elevated/swollen right optic nerve.  This was confirmed with OCT testing.  At no time did she notice any change in visual acuity, visual field or color vision..       Repeat visit to ophthalmology, Dr. Paticia Fairly [facility]) showed no disc edema or optic neuritis,     She is otherwise healthy and takes no medications except for multivitamins.    She has no personal history of an autoimmune disorder.  No family history of MS or other autoimmune disorders.  She had a Covid-19 booster (Pfizer) abput 3 days before the onset of symptoms.     She had positive ANA and ENA showed SSA and SSB antibodies.   RA also elevated.   She saw rheumatology (Dr. Jeannetta) and diagnosed with Sjogren's.  Plaquenil  was started but she had a rash and it was discontinued.     She is anti-MOG positive (initial titer was 1: 320 and follow-up titer was down to 1: 10).  The titer in May 2025 was negative  IMAGING: MRI brain 09/03/2020 showed  This is a normal MRI of the brain with and without contrast.  Incidental note is made of 3 mm cerebellar  tonsil ectopia, not enough to be considered a Chiari malformation.   MRI orbits 09/03/2020 showed: 1.   No acute findings.  Normal enhancement. 2.   Optic nerves appear normal.   3.   Mild asymmetry of the lacrimal glands, larger on the right.  This could be incidental as the dimensions of the glands are within normal limits.  They have normal signal.  LABS: 08/13/2020:  ANA positive with ENA showing increased SSA and SSB 09/11/2021:  decreased C4 12 (15-57), normal C3, elevated RF=66, increased IgG=3800 (normal IgM and IgA) , elevated IgG4 97.9 (4-86) 11/15/2020: SPEP with increased gamma globulin but no M spike; RA titer = 160 (<10 normal); HIV negative; RPR negative; Lyme negative, Bartonella  negative, anti-CCP negative; HLA,   NMO IgG negative, TSH nornmal, Vit D normal, ESR/CRP normal  REVIEW OF SYSTEMS: Constitutional: No fevers, chills, sweats, or change in appetite Eyes: No visual changes, double vision, eye pain Ear, nose and throat: No hearing loss, ear pain, nasal congestion, sore throat Cardiovascular: No chest pain, palpitations Respiratory:  No shortness of breath at rest or with exertion.   No wheezes GastrointestinaI: No nausea, vomiting, diarrhea, abdominal pain, fecal incontinence Genitourinary:  No dysuria, urinary retention or frequency.  No nocturia. Musculoskeletal:  No neck pain, back pain Integumentary: No rash, pruritus, skin lesions Neurological: as above Psychiatric: No depression at this time.  No anxiety Endocrine: No palpitations, diaphoresis, change in appetite, change in weigh or increased thirst Hematologic/Lymphatic:  No anemia, purpura, petechiae. Allergic/Immunologic: No itchy/runny eyes, nasal congestion, recent allergic reactions, rashes  ALLERGIES: Allergies  Allergen Reactions   Doxycycline  Other (See Comments)    Epigastric abdominal pain and esophageal spasm   Plaquenil  [Hydroxychloroquine ]     rash    HOME MEDICATIONS:  Current Outpatient Medications:    Lifitegrast (XIIDRA) 5 % SOLN, Apply to eye., Disp: , Rfl:    Multiple Vitamin (MULTIVITAMIN) tablet, Take 1 tablet by mouth daily., Disp: , Rfl:    PARAGARD  INTRAUTERINE COPPER  IU, by Intrauterine route., Disp: , Rfl:    riTUXimab (RITUXAN) 100 MG/10ML injection, every 6 (six) months., Disp: , Rfl:   PAST MEDICAL HISTORY: Past Medical History:  Diagnosis Date   Acid reflux 07/08/2023   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)    Optic neuritis    Sjogren's syndrome     PAST SURGICAL HISTORY: Past Surgical History:  Procedure Laterality Date   DILATION AND CURETTAGE OF UTERUS  2011    FAMILY HISTORY: Family History  Problem Relation Age  of Onset   Diabetes Mother    Colon polyps Mother    Hyperlipidemia Father    High blood pressure Father    Breast cancer Paternal Grandmother        Post menopause   Heart attack Paternal Grandfather     SOCIAL HISTORY:  Social History   Socioeconomic History   Marital status: Married    Spouse name: Not on file   Number of children: 2   Years of education: Not on file   Highest education level: Not on file  Occupational History   Not on file  Tobacco Use   Smoking status: Never    Passive exposure: Never   Smokeless tobacco: Never  Vaping Use   Vaping status: Never Used  Substance and Sexual Activity   Alcohol use: Yes    Comment: occasionally    Drug use: Never   Sexual activity: Yes    Birth control/protection: Condom, I.U.D.  Comment: Paragard  IUD inserted 11/27/18  Other Topics Concern   Not on file  Social History Narrative   Pt lives with family    Pt works    Social Drivers of Health   Tobacco Use: Low Risk (01/20/2024)   Patient History    Smoking Tobacco Use: Never    Smokeless Tobacco Use: Never    Passive Exposure: Never  Financial Resource Strain: Not on file  Food Insecurity: Not on file  Transportation Needs: Not on file  Physical Activity: Not on file  Stress: Not on file  Social Connections: Not on file  Intimate Partner Violence: Not on file  Depression (EYV7-0): Not on file  Alcohol Screen: Not on file  Housing: Not on file  Utilities: Not on file  Health Literacy: Not on file     PHYSICAL EXAM  Vitals:   01/20/24 1044  BP: 123/87  Pulse: 79  SpO2: 97%  Weight: 174 lb 8 oz (79.2 kg)  Height: 5' 6 (1.676 m)    Body mass index is 28.17 kg/m.  Visual acuity: OD 20/25 OS 20/20 Symmetric color vision  General: The patient is well-developed and well-nourished and in no acute distress  HEENT:  Head is [place]/AT.  Sclera are anicteric.     Skin: Extremities are without rash or  edema.  Musculoskeletal:  Back is  nontender  Neurologic Exam  Mental status: The patient is alert and oriented x 3 at the time of the examination. The patient has apparent normal recent and remote memory, with an apparently normal attention span and concentration ability.   Speech is normal.  Cranial nerves: Extraocular movements are full.  She had a mild afferent pupillary defect on the right.  Color vision was symmetric, though vision is mildly blurry OD.  Facial strength and sensation was normal.  Trapezius and sternocleidomastoid strength is normal. No dysarthria is noted.  No obvious hearing deficits are noted.  Motor:  Muscle bulk is normal.   Tone is normal. Strength appears to be 5 / 5 in all 4 extremities.  She is able to rise up from a chair without using her arms at least 3 times.  Sensory: Sensory testing is intact to pinprick, soft touch and vibration sensation in all 4 extremities.  Coordination: Cerebellar testing reveals good finger-nose-finger and heel-to-shin bilaterally.  Gait and station: Station is normal.   Gait and tandem gait are normal.  No Romberg sign.  Reflexes: Deep tendon reflexes are symmetric and normal bilaterally.      DIAGNOSTIC DATA (LABS, IMAGING, TESTING) - I reviewed patient records, labs, notes, testing and imaging myself where available.  Lab Results  Component Value Date   WBC 6.9 01/13/2024   HGB 11.8 01/13/2024   HCT 35.7 (L) 01/13/2024   MCV 86.4 01/13/2024   PLT 250 01/13/2024      Component Value Date/Time   NA 139 01/13/2024 1537   NA 139 07/08/2023 1001   K 4.1 01/13/2024 1537   CL 104 01/13/2024 1537   CO2 28 01/13/2024 1537   GLUCOSE 83 01/13/2024 1537   BUN 14 01/13/2024 1537   BUN 10 07/08/2023 1001   CREATININE 0.72 01/13/2024 1537   CALCIUM 9.2 01/13/2024 1537   PROT 7.1 01/13/2024 1537   PROT 7.9 06/20/2022 1652   ALBUMIN 4.4 06/20/2022 1652   AST 26 01/13/2024 1537   ALT 23 01/13/2024 1537   ALKPHOS 74 06/20/2022 1652   BILITOT 0.3 01/13/2024  1537   BILITOT <0.2 06/20/2022  1652   GFRNONAA 85 11/17/2018 1155   GFRAA 98 11/17/2018 1155   Lab Results  Component Value Date   CHOL 149 11/17/2018   HDL 59 11/17/2018   LDLCALC 74 11/17/2018   TRIG 76 11/17/2018   CHOLHDL 2.5 11/17/2018   Lab Results  Component Value Date   HGBA1C 4.9 07/06/2016   Lab Results  Component Value Date   VITAMINB12 855 06/20/2022   Lab Results  Component Value Date   TSH 0.851 06/20/2022        ASSESSMENT AND PLAN  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  However, her Sjogren's syndrome is not doing as well and she has had more issues with dry eyes and to a lesser extent dry mouth.  Dr. Jeannetta is considering a change in therapy.  I discussed this with her.  From the MOGAD perspective, there is some evidence that azathioprine and MMF could be of benefit.  Therefore, 1 of those 2 options would be reasonable to try to cover both the MOGAD and Sjogren syndrome.  She actually became seronegative when we checked her at the last visit.  Being seronegative is associated with less risk and MOGAD.  I will recheck her.  If she does have an exacerbation we will need to reconsider a different treatment such as a combination of Rituxan plus a DMA RD or different therapy.  Actemra has good benefit for MOGAD but there is less evidence that it helps your organs.    She will continue treatment for dry eyes.  Vision has been stable. 3.   She will return to see us  in about 7 months or as needed and call if she has new or worsening neurologic symptoms.   Micahel Omlor A. Vear, MD, [facility] 01/20/2024, 11:33 AM Certified in Neurology, Clinical Neurophysiology, Sleep Medicine and Neuroimaging  [facility] [address], [address] [place], [place] [address] [phone]

## 2024-01-21 ENCOUNTER — Other Ambulatory Visit: Payer: Self-pay | Admitting: *Deleted

## 2024-01-21 MED ORDER — PILOCARPINE HCL 5 MG PO TABS: 5.0000 mg | ORAL_TABLET | Freq: Every day | ORAL | 2 refills | Status: DC

## 2024-01-21 NOTE — Telephone Encounter (Signed)
 Patient contacted the office stating she was waiting for a prescription for Pilocarpine  to be sent to the pharmacy.  Per office note on 01/13/2024: Prescribed pilocarpine  5 mg once daily, with potential dose increase based on response and tolerance.

## 2024-01-22 ENCOUNTER — Ambulatory Visit: Payer: Self-pay | Admitting: Neurology

## 2024-01-22 LAB — ANTI-MOG, SERUM: MOG Antibody, Cell-based IFA: NEGATIVE

## 2024-02-13 ENCOUNTER — Encounter: Payer: Self-pay | Admitting: Internal Medicine

## 2024-02-13 MED ORDER — PILOCARPINE HCL 5 MG PO TABS: 5.0000 mg | ORAL_TABLET | Freq: Every day | ORAL | 2 refills | Status: AC

## 2024-04-14 ENCOUNTER — Ambulatory Visit: Admitting: Internal Medicine

## 2024-09-10 ENCOUNTER — Ambulatory Visit: Admitting: Neurology
